# Patient Record
Sex: Female | Born: 1980
Health system: Southern US, Community
[De-identification: ages and names within clinical notes are randomized; demographics above are authoritative.]

## PROBLEM LIST (undated history)

## (undated) ENCOUNTER — Emergency Department

## (undated) DIAGNOSIS — L732 Hidradenitis suppurativa: Secondary | ICD-10-CM

## (undated) DIAGNOSIS — J45909 Unspecified asthma, uncomplicated: Secondary | ICD-10-CM

## (undated) HISTORY — PX: DILATION AND CURETTAGE OF UTERUS: SHX78

## (undated) HISTORY — PX: TONSILLECTOMY: SUR1361

## (undated) HISTORY — DX: Unspecified asthma, uncomplicated: J45.909

## (undated) HISTORY — DX: Hidradenitis suppurativa: L73.2

---

## 2003-10-24 DIAGNOSIS — L732 Hidradenitis suppurativa: Secondary | ICD-10-CM | POA: Insufficient documentation

## 2005-02-12 ENCOUNTER — Emergency Department: Payer: Self-pay | Admitting: Emergency Medicine

## 2005-10-19 ENCOUNTER — Emergency Department: Payer: Self-pay | Admitting: Emergency Medicine

## 2006-05-10 ENCOUNTER — Emergency Department: Payer: Self-pay | Admitting: Emergency Medicine

## 2006-05-18 ENCOUNTER — Encounter: Payer: Self-pay | Admitting: Emergency Medicine

## 2006-05-29 ENCOUNTER — Ambulatory Visit: Payer: Self-pay | Admitting: Orthopedic Surgery

## 2006-05-29 ENCOUNTER — Encounter: Payer: Self-pay | Admitting: Emergency Medicine

## 2006-06-03 ENCOUNTER — Emergency Department: Payer: Self-pay | Admitting: Emergency Medicine

## 2006-09-22 ENCOUNTER — Other Ambulatory Visit: Payer: Self-pay

## 2006-09-22 ENCOUNTER — Emergency Department: Payer: Self-pay | Admitting: Emergency Medicine

## 2007-10-27 ENCOUNTER — Observation Stay: Payer: Self-pay

## 2007-11-02 ENCOUNTER — Inpatient Hospital Stay: Payer: Self-pay

## 2007-11-02 ENCOUNTER — Emergency Department: Payer: Self-pay | Admitting: Emergency Medicine

## 2008-08-31 IMAGING — CR DG CHEST 2V
1 series · 2 of 2 positions shown · non-contrast
Comparison: none

REASON FOR EXAM: chest pain
COMMENTS:

PROCEDURE:     DXR - DXR CHEST PA (OR AP) AND LATERAL  - September 22, 2006  [DATE]
RESULT:     Comparison is made to the prior exam 10/19/2005. The lung fields
are clear. The heart, mediastinal and osseous structures are normal in
appearance.

[Series 1: view not recorded · 0.17mm/px · 2 of 2 slices shown]
[im 1/2]
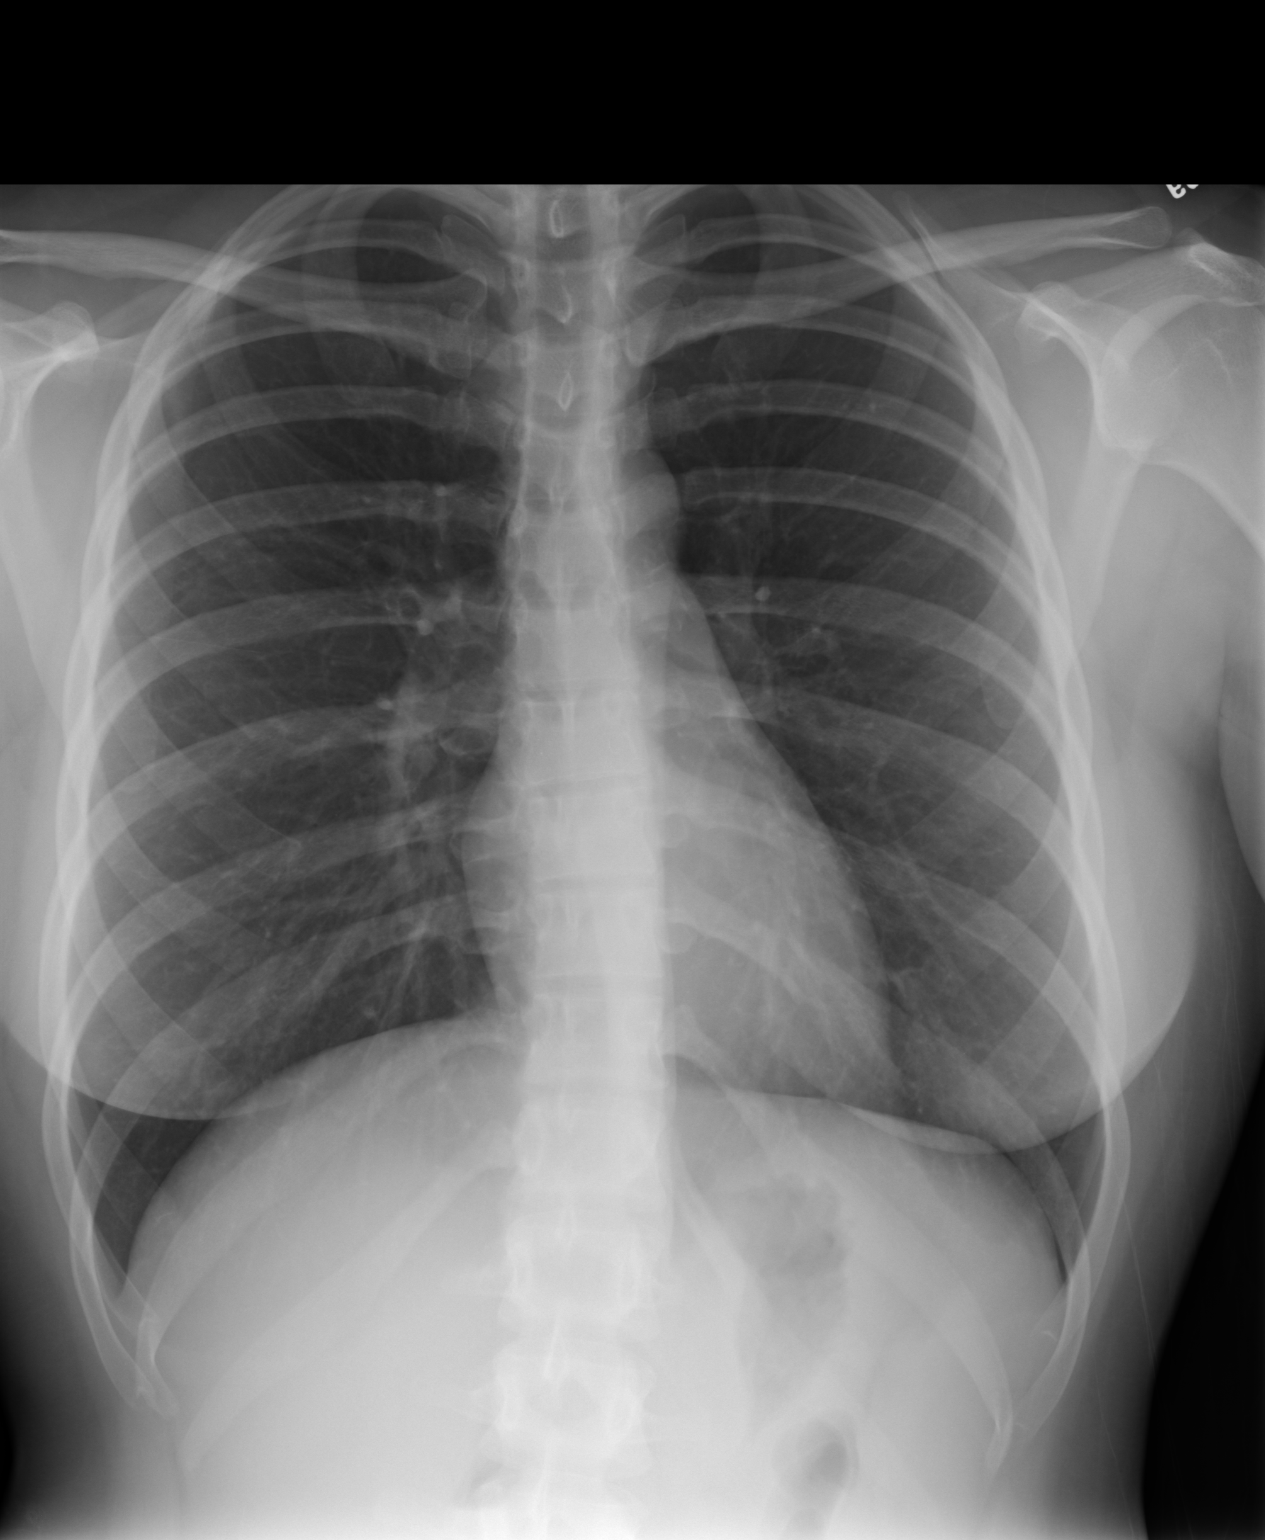
[im 2/2]
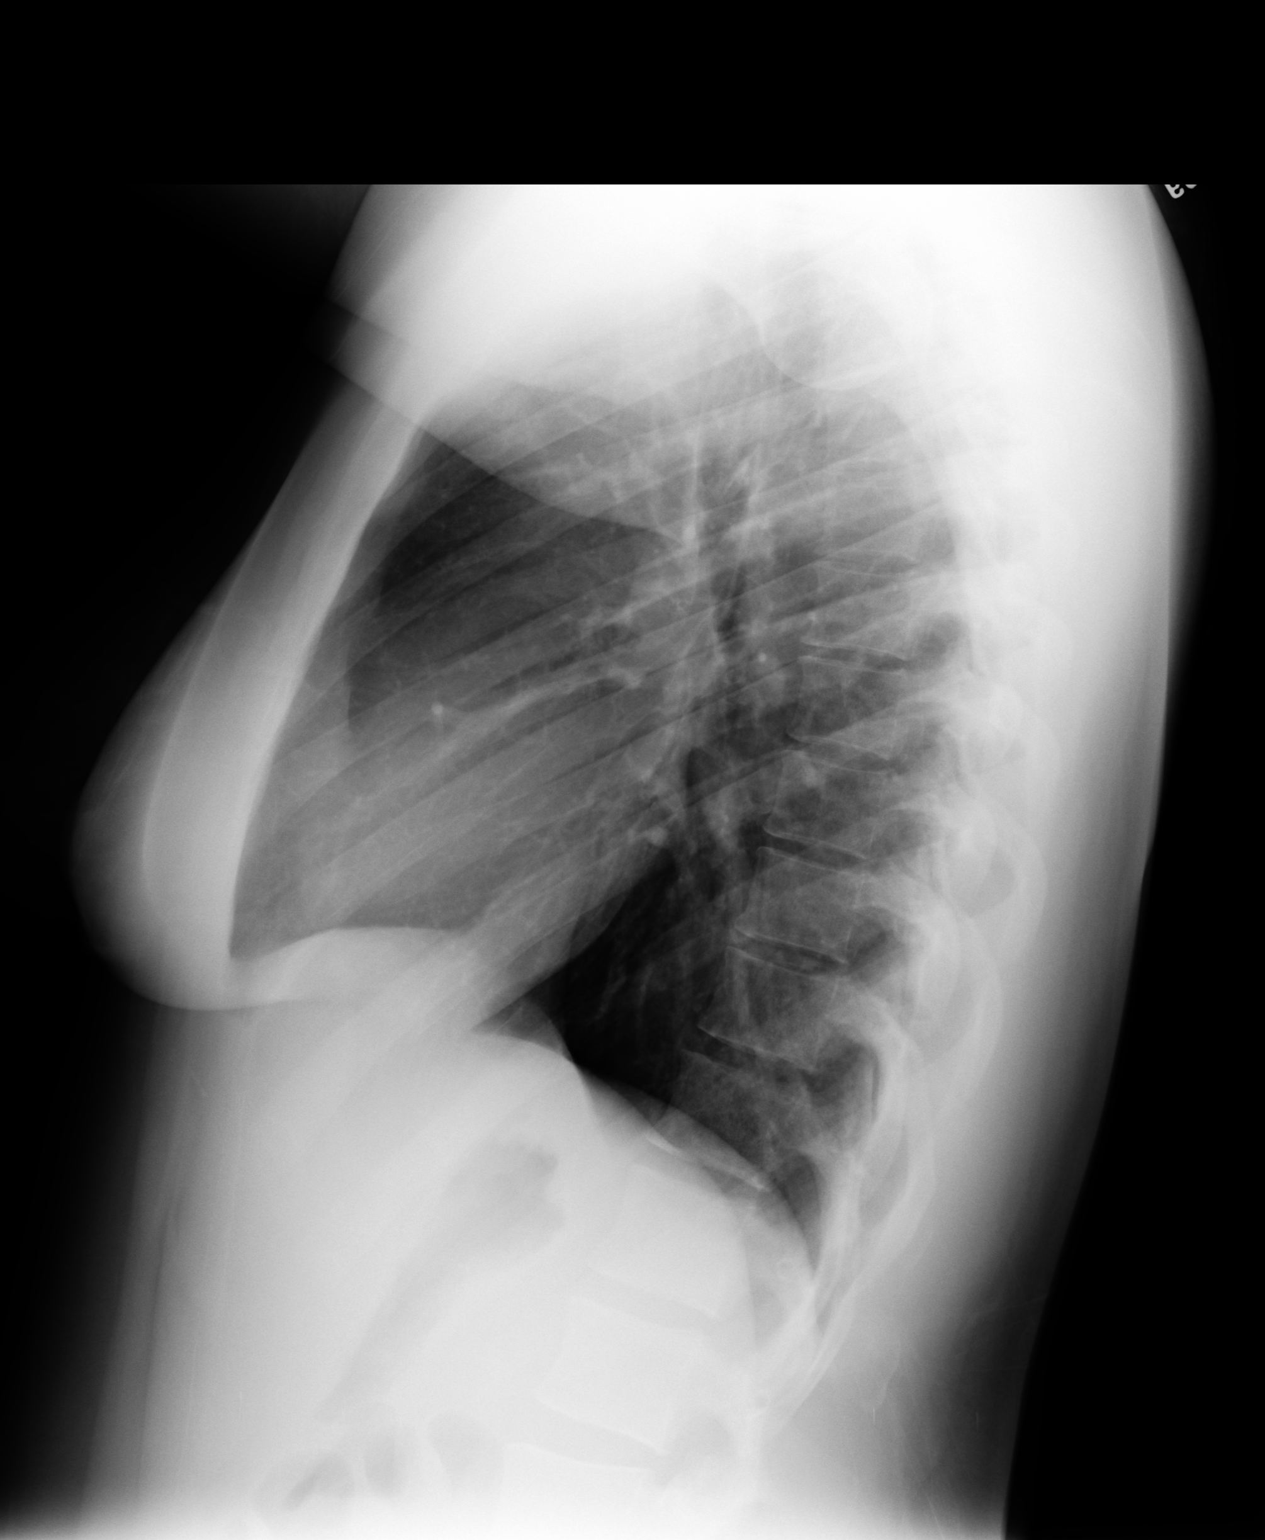

[2 of 2 positions shown; findings below may reference images not displayed]

IMPRESSION: 1.     No acute changes are identified.

## 2011-10-05 ENCOUNTER — Ambulatory Visit: Payer: Self-pay | Admitting: Emergency Medicine

## 2011-10-05 LAB — URINALYSIS, COMPLETE
Ketone: NEGATIVE
Nitrite: POSITIVE

## 2018-10-14 DIAGNOSIS — Z7189 Other specified counseling: Secondary | ICD-10-CM | POA: Diagnosis not present

## 2018-10-14 DIAGNOSIS — Z03818 Encounter for observation for suspected exposure to other biological agents ruled out: Secondary | ICD-10-CM | POA: Diagnosis not present

## 2019-07-16 DIAGNOSIS — J3489 Other specified disorders of nose and nasal sinuses: Secondary | ICD-10-CM | POA: Diagnosis not present

## 2019-07-16 DIAGNOSIS — R0981 Nasal congestion: Secondary | ICD-10-CM | POA: Diagnosis not present

## 2019-07-16 DIAGNOSIS — Z20822 Contact with and (suspected) exposure to covid-19: Secondary | ICD-10-CM | POA: Diagnosis not present

## 2019-07-16 DIAGNOSIS — R519 Headache, unspecified: Secondary | ICD-10-CM | POA: Diagnosis not present

## 2019-07-16 DIAGNOSIS — R05 Cough: Secondary | ICD-10-CM | POA: Diagnosis not present

## 2019-08-08 ENCOUNTER — Telehealth: Payer: Self-pay | Admitting: Obstetrics and Gynecology

## 2019-08-08 NOTE — Telephone Encounter (Signed)
Patient is schedule 08/24/19 at 9:30 with AMS for annual and mirena replacement in [place]

## 2019-08-24 ENCOUNTER — Ambulatory Visit (INDEPENDENT_AMBULATORY_CARE_PROVIDER_SITE_OTHER): Payer: 59 | Admitting: Obstetrics and Gynecology

## 2019-08-24 ENCOUNTER — Other Ambulatory Visit: Payer: Self-pay

## 2019-08-24 ENCOUNTER — Encounter: Payer: Self-pay | Admitting: Obstetrics and Gynecology

## 2019-08-24 ENCOUNTER — Other Ambulatory Visit (HOSPITAL_COMMUNITY)
Admission: RE | Admit: 2019-08-24 | Discharge: 2019-08-24 | Disposition: A | Discharge status: Home / Self Care | Payer: 59 | Source: Ambulatory Visit | Attending: Obstetrics and Gynecology | Admitting: Obstetrics and Gynecology

## 2019-08-24 VITALS — BP 129/93 | HR 108 | Ht 65.0 in | Wt 235.0 lb

## 2019-08-24 DIAGNOSIS — Z30011 Encounter for initial prescription of contraceptive pills: Secondary | ICD-10-CM | POA: Diagnosis not present

## 2019-08-24 DIAGNOSIS — Z113 Encounter for screening for infections with a predominantly sexual mode of transmission: Secondary | ICD-10-CM

## 2019-08-24 DIAGNOSIS — Z579 Occupational exposure to unspecified risk factor: Secondary | ICD-10-CM | POA: Diagnosis not present

## 2019-08-24 DIAGNOSIS — Z30432 Encounter for removal of intrauterine contraceptive device: Secondary | ICD-10-CM

## 2019-08-24 DIAGNOSIS — Z124 Encounter for screening for malignant neoplasm of cervix: Secondary | ICD-10-CM

## 2019-08-24 DIAGNOSIS — Z1239 Encounter for other screening for malignant neoplasm of breast: Secondary | ICD-10-CM | POA: Diagnosis not present

## 2019-08-24 DIAGNOSIS — T8332XA Displacement of intrauterine contraceptive device, initial encounter: Secondary | ICD-10-CM

## 2019-08-24 DIAGNOSIS — Z01419 Encounter for gynecological examination (general) (routine) without abnormal findings: Secondary | ICD-10-CM

## 2019-08-24 DIAGNOSIS — R87612 Low grade squamous intraepithelial lesion on cytologic smear of cervix (LGSIL): Secondary | ICD-10-CM | POA: Diagnosis not present

## 2019-08-24 MED ORDER — LO LOESTRIN FE 1 MG-10 MCG / 10 MCG PO TABS: 1.0000 | ORAL_TABLET | Freq: Every day | ORAL | 0 refills | Status: DC

## 2019-08-24 NOTE — Progress Notes (Signed)
Gynecology Annual Exam   PCP: Patient, No Pcp Per  Chief Complaint:  Chief Complaint  Patient presents with  . Gynecologic Exam  . Contraception    IUD replacement Mirena    History of Present Illness: Patient is a 39 y.o. [id number] presents for annual exam. The patient has no complaints today.   LMP: No LMP recorded. (Menstrual status: IUD). Amenorrhea secondary to Mirena IUD  The patient is sexually active. She currently uses IUD for contraception. She denies dyspareunia.  The patient does perform self breast exams.  There is no notable family history of breast or ovarian cancer in her family.  The patient wears seatbelts: yes.   The patient has regular exercise: not asked.    The patient denies current symptoms of depression.    She has been dealing with hydradenitis with mostly axillary manifestations.  Is interested in changing from IUD secondary to recommendation by her dermatologist.    Review of Systems: Review of Systems  Constitutional: Negative for chills and fever.  HENT: Negative for congestion.   Respiratory: Negative for cough and shortness of breath.   Cardiovascular: Negative for chest pain and palpitations.  Gastrointestinal: Negative for abdominal pain, constipation, diarrhea, heartburn, nausea and vomiting.  Genitourinary: Negative for dysuria, frequency and urgency.  Skin: Positive for rash. Negative for itching.  Neurological: Negative for dizziness and headaches.  Endo/Heme/Allergies: Negative for polydipsia.  Psychiatric/Behavioral: Negative for depression.    Past Medical History:  There are no problems to display for this patient.   Past Surgical History:  Past Surgical History:  Procedure Laterality Date  . DILATION AND CURETTAGE OF UTERUS     SAB    Gynecologic History:  No LMP recorded. (Menstrual status: IUD). Contraception:Mirena IUD  Obstetric History: No obstetric history on file.  Family History:  Family History  Problem  Relation Age of Onset  . Lung cancer Maternal Grandfather     Social History:  Social History   Socioeconomic History  . Marital status: Married    Spouse name: Not on file  . Number of children: Not on file  . Years of education: Not on file  . Highest education level: Not on file  Occupational History  . Occupation: [employer]  Tobacco Use  . Smoking status: Current Every Day Smoker    Types: E-cigarettes  . Smokeless tobacco: Never Used  Substance and Sexual Activity  . Alcohol use: Yes    Comment: Occ  . Drug use: Never  . Sexual activity: Yes    Partners: Male    Birth control/protection: I.U.D.  Other Topics Concern  . Not on file  Social History Narrative  . Not on file   Social Determinants of Health   Financial Resource Strain:   . Difficulty of Paying Living Expenses:   Food Insecurity:   . Worried About Charity fundraiser in the Last Year:   . Arboriculturist in the Last Year:   Transportation Needs:   . Film/video editor (Medical):   Marland Kitchen Lack of Transportation (Non-Medical):   Physical Activity:   . Days of Exercise per Week:   . Minutes of Exercise per Session:   Stress:   . Feeling of Stress :   Social Connections:   . Frequency of Communication with Friends and Family:   . Frequency of Social Gatherings with Friends and Family:   . Attends Religious Services:   . Active Member of Clubs or Organizations:   .  Attends Banker Meetings:   Marland Kitchen Marital Status:   Intimate Partner Violence:   . Fear of Current or Ex-Partner:   . Emotionally Abused:   Marland Kitchen Physically Abused:   . Sexually Abused:     Allergies:  Allergies  Allergen Reactions  . Penicillins Rash    Medications: Prior to Admission medications   Not on File    Physical Exam Vitals: Blood pressure (!) 129/93, pulse (!) 108, height 5\' 5"  (1.651 m), weight 235 lb (106.6 kg).  General: NAD HEENT: normocephalic, anicteric Thyroid: no enlargement, no palpable  nodules Pulmonary: No increased work of breathing, CTAB Cardiovascular: RRR, distal pulses 2+ Breast: Breast symmetrical, no tenderness, no palpable nodules or masses, no skin or nipple retraction present, no nipple discharge.  No axillary or supraclavicular lymphadenopathy. Abdomen: NABS, soft, non-tender, non-distended.  Umbilicus without lesions.  No hepatomegaly, splenomegaly or masses palpable. No evidence of hernia  Genitourinary:  External: Normal external female genitalia.  Normal urethral meatus, normal Bartholin's and Skene's glands.    Vagina: Normal vaginal mucosa, no evidence of prolapse.    Cervix: Grossly normal in appearance, no bleeding  Uterus: Non-enlarged, mobile, normal contour.  No CMT  Adnexa: ovaries non-enlarged, no adnexal masses  Rectal: deferred  Lymphatic: no evidence of inguinal lymphadenopathy Extremities: no edema, erythema, or tenderness Neurologic: Grossly intact Psychiatric: mood appropriate, affect full  Female chaperone present for pelvic and breast  portions of the physical exam   IUD Removal  Patient identified, informed consent performed, consent signed.  Patient was in the dorsal lithotomy position, normal external genitalia was noted.  A speculum was placed in the patient's vagina, normal discharge was noted, no lesions. The cervix was visualized, no lesions, no abnormal discharge.  The strings of the IUD were grasped and pulled using ring forceps. The strings of the IUD were not visualized, unable to be located with pap brush or Kelly forceps, so an IUD retrieval hook was introduced into the endometrial cavity following betadine application and the IUD was grasped and removed in its entirety.  Patient tolerated the procedure well.     Assessment: 39 y.o. 31  routine annual exam  Plan: Problem List Items Addressed This Visit    None    Visit Diagnoses    Encounter for gynecological examination without abnormal finding    -  Primary    Screening for malignant neoplasm of cervix       Relevant Orders   Cytology - PAP   Breast screening       Routine screening for STI (sexually transmitted infection)       Relevant Orders   Hepatitis C antibody   HEP, RPR, HIV Panel   Occupational exposure in workplace       Relevant Orders   Hepatitis C antibody   HEP, RPR, HIV Panel   Encounter for IUD removal       Initiation of oral contraception       Intrauterine contraceptive device threads lost, initial encounter          1) STI screening  wasoffered and accepted - no risk factors 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  Patient opts for every 3 years screening interval  3) Contraception - the patient is currently using  IUD.  She is interested in changing to [facility]  -  IUD removed today, switch to Lo Loestrin Fe - Patient not interested in future fertility discussed pros and cons of vasectomy and BTL - No clear data regarding OCP vs Mirena in clinical course of hydradenitis.  We discussed if menses heavy and no improvement in hydradenitis replacement of IUD may be reasonable  4) Routine healthcare maintenance including cholesterol, diabetes screening discussed managed by PCP   5) Return in about 3 months (around 11/24/2019) for medication follow up (in person, phone, or video).   Vena Austria, MD, [facility], [facility] 08/24/2019, 9:53 AM

## 2019-08-25 LAB — HEP, RPR, HIV PANEL
HIV Screen 4th Generation wRfx: NONREACTIVE
Hepatitis B Surface Ag: NEGATIVE
RPR Ser Ql: NONREACTIVE

## 2019-08-25 LAB — CYTOLOGY - PAP
Comment: NEGATIVE
High risk HPV: NEGATIVE

## 2019-08-25 LAB — HEPATITIS C ANTIBODY: Hep C Virus Ab: <0.1 s/co ratio (ref 0.0–0.9)

## 2019-11-20 ENCOUNTER — Telehealth (INDEPENDENT_AMBULATORY_CARE_PROVIDER_SITE_OTHER): Payer: 59 | Admitting: Obstetrics and Gynecology

## 2019-11-20 ENCOUNTER — Other Ambulatory Visit: Payer: Self-pay

## 2019-11-20 DIAGNOSIS — Z3009 Encounter for other general counseling and advice on contraception: Secondary | ICD-10-CM

## 2019-11-20 MED ORDER — ETONOGESTREL-ETHINYL ESTRADIOL 0.12-0.015 MG/24HR VA RING: VAGINAL_RING | VAGINAL | 3 refills | Status: DC

## 2019-11-20 NOTE — Progress Notes (Signed)
I connected with Haley Hall  on 11/20/19 at 11:30 AM EDT by video chat.   I discussed the limitations, risks, security and privacy concerns of performing an evaluation and management service by telephone and the availability of in person appointments. I also discussed with the patient that there may be a patient responsible charge related to this service. The patient expressed understanding and agreed to proceed.  The patient was at home. I spoke with the patient from my workstation phone. The names of people involved in this encounter were: Haley Hall , and [facility]   Obstetrics & Gynecology Office Visit   Chief Complaint: No chief complaint on file.   History of Present Illness: 39 y.o. [id number] presenting for medication follow up for a diagnosis of hydradenitis, with recommendation by dermatology to come off IUD.  She is currently being managed with Lo Loestrin FE.   The patient reports worsening in hydradenitis symptoms since discontinuing IUD.  On her current medication regimen has had good cycle control.   She has not noted any side-effects or new symptoms.    Review of Systems: Review of Systems  Constitutional: Negative.   Gastrointestinal: Negative.   Genitourinary: Negative.   Skin: Positive for rash. Negative for itching.    Past Medical History:  Past Medical History:  Diagnosis Date   Asthma    Hydradenitis     Past Surgical History:  Past Surgical History:  Procedure Laterality Date   DILATION AND CURETTAGE OF UTERUS     SAB    Gynecologic History: No LMP recorded. (Menstrual status: IUD).  Obstetric History: [id number]  Family History:  Family History  Problem Relation Age of Onset   Lung cancer Maternal Grandfather     Social History:  Social History   Socioeconomic History   Marital status: Married    Spouse name: Not on file   Number of children: Not on file   Years of education: Not on file   Highest education  level: Not on file  Occupational History   Occupation: [employer]  Tobacco Use   Smoking status: Current Every Day Smoker    Types: E-cigarettes   Smokeless tobacco: Never Used  Building services engineer Use: Every day  Substance and Sexual Activity   Alcohol use: Yes    Comment: Occ   Drug use: Never   Sexual activity: Yes    Partners: Male    Birth control/protection: I.U.D.  Other Topics Concern   Not on file  Social History Narrative   Not on file   Social Determinants of Health   Financial Resource Strain:    Difficulty of Paying Living Expenses: Not on file  Food Insecurity:    Worried About Running Out of Food in the Last Year: Not on file   Ran Out of Food in the Last Year: Not on file  Transportation Needs:    Lack of Transportation (Medical): Not on file   Lack of Transportation (Non-Medical): Not on file  Physical Activity:    Days of Exercise per Week: Not on file   Minutes of Exercise per Session: Not on file  Stress:    Feeling of Stress : Not on file  Social Connections:    Frequency of Communication with Friends and Family: Not on file   Frequency of Social Gatherings with Friends and Family: Not on file   Attends Religious Services: Not on file   Active Member of Clubs or Organizations: Not  on file   Attends Club or Organization Meetings: Not on file   Marital Status: Not on file  Intimate Partner Violence:    Fear of Current or Ex-Partner: Not on file   Emotionally Abused: Not on file   Physically Abused: Not on file   Sexually Abused: Not on file    Allergies:  Allergies  Allergen Reactions   Penicillins Rash    Medications: Prior to Admission medications   Medication Sig Start Date End Date Taking? Authorizing Provider  cetirizine (ZYRTEC) 10 MG tablet Take by mouth.    [provider]  etonogestrel-ethinyl estradiol (NUVARING) 0.12-0.015 MG/24HR vaginal ring Insert vaginally and leave in place for 3  consecutive weeks, then remove for 1 week. 11/20/19   Vena Austria, MD  levonorgestrel (MIRENA) 20 MCG/24HR IUD by Intrauterine route.    [provider]  Norethindrone-Ethinyl Estradiol-Fe Biphas (LO LOESTRIN FE) 1 MG-10 MCG / 10 MCG tablet Take 1 tablet by mouth daily. 08/24/19 11/16/19  Vena Austria, MD    Physical Exam Vitals: There were no vitals filed for this visit. No LMP recorded. (Menstrual status: IUD).  No physical exam as this was a remote telephone visit to promote social distancing during the current COVID-19 Pandemic   Assessment: 39 y.o. follow up contraceptive management  Plan: Problem List Items Addressed This Visit    None     1) Hydradenitis - no improvement in symptoms after IUD removal.  Menses regular on LoLoestrin.  Is interested in a more convenient contraceptive option so discussed Nuvaring, Depo Provera, contraceptive patch, nexplanon, ore replacement of IUD.  Discussed that worsening may not be related to IUD removal and starting of OCP but given hydradenitis is a disorder of the sweat glands it may be related to increased sweating in the summer months - patient interested in Nuvaring Rx sent  2) Video Time 17:21 minutes  3) Return in about 9 months (around 08/19/2020) for Annual.   Vena Austria, MD, [facility], [facility] 11/20/2019, 11:46 AM

## 2019-11-22 ENCOUNTER — Telehealth: Payer: 59 | Admitting: Obstetrics and Gynecology

## 2021-02-25 NOTE — Telephone Encounter (Signed)
Patient decided OCP's.  

## 2021-04-25 ENCOUNTER — Encounter: Payer: Self-pay | Admitting: Nurse Practitioner

## 2021-04-25 ENCOUNTER — Ambulatory Visit: Payer: 59 | Admitting: Nurse Practitioner

## 2021-04-25 ENCOUNTER — Other Ambulatory Visit: Payer: Self-pay

## 2021-04-25 ENCOUNTER — Other Ambulatory Visit (HOSPITAL_COMMUNITY): Payer: Self-pay

## 2021-04-25 VITALS — BP 132/84 | HR 98 | Temp 98.2°F | Resp 16 | Ht 65.0 in | Wt 265.0 lb

## 2021-04-25 DIAGNOSIS — Z13220 Encounter for screening for lipoid disorders: Secondary | ICD-10-CM

## 2021-04-25 DIAGNOSIS — R000 Tachycardia, unspecified: Secondary | ICD-10-CM | POA: Diagnosis not present

## 2021-04-25 DIAGNOSIS — Z131 Encounter for screening for diabetes mellitus: Secondary | ICD-10-CM | POA: Diagnosis not present

## 2021-04-25 DIAGNOSIS — F419 Anxiety disorder, unspecified: Secondary | ICD-10-CM

## 2021-04-25 DIAGNOSIS — L732 Hidradenitis suppurativa: Secondary | ICD-10-CM

## 2021-04-25 DIAGNOSIS — Z6841 Body Mass Index (BMI) 40.0 and over, adult: Secondary | ICD-10-CM | POA: Diagnosis not present

## 2021-04-25 DIAGNOSIS — F32A Depression, unspecified: Secondary | ICD-10-CM | POA: Diagnosis not present

## 2021-04-25 DIAGNOSIS — Z7689 Persons encountering health services in other specified circumstances: Secondary | ICD-10-CM

## 2021-04-25 MED ORDER — SERTRALINE HCL 25 MG PO TABS
25.0000 mg | ORAL_TABLET | Freq: Every day | ORAL | 0 refills | Status: DC
  Filled 2021-04-25: qty 30, 30d supply, fill #0

## 2021-04-25 NOTE — Progress Notes (Signed)
BP 132/84    Pulse 98    Temp 98.2 F (36.8 C) (Oral)    Resp 16    Ht 5\' 5"  (1.651 m)    Wt 265 lb (120.2 kg)    LMP 04/24/2021    SpO2 99%    BMI 44.10 kg/m    Subjective:    Patient ID: Haley Hall, female    DOB: July 22, 1980, 41 y.o.   MRN: [id number]  HPI: Haley Hall is a 41 y.o. female  Chief Complaint  Patient presents with   Establish Care   Obesity    Discuss weight loss options   Referral    dermatologist   Establish care: Last physical was many years ago. History of childhood asthma, hidradenitis suppurative.  She is not currently taking any medications right now.  Hidradenitis suppurative:  Would like referral to dermatology. She has a cream that she uses on it.  She says it is not bad right now but she says it can get flared up every once in awhile. She would like to see a dermatologist for further care.   Obesity: She says that her highest weigh is 265 lbs.  She says she has tried phentermine, diet, exercise., weight watchers, optevia.  She would like to start weight loss medication. We discussed all the options and she is going to call her insurance company and see what they will cover.  Will also get blood work.   Tachycardia: She says she has had tachycardia for many years.  Has done ekgs at work and they were Sinus Tachycardia. (She works at a cardiology office).  She denies any chest pain or shortness of breath. Just says her heart rate is always around 110-115.  Will get labs.  Anxiety/Depression:  PHQ9 and GAD are positive. She says that there is no one incident that is causing her mood. She says it is just life.  She denies any suicidal thoughts. Discussed if she would like to start medication and she said yes. Discussed different treatment options. Will start Zoloft and follow-up in four weeks.  Depression screen PHQ 2/9 04/25/2021  Decreased Interest 1  Down, Depressed, Hopeless 1  PHQ - 2 Score 2  Altered sleeping 3  Tired, decreased energy 3   Change in appetite 0  Feeling bad or failure about yourself  1  Trouble concentrating 3  Moving slowly or fidgety/restless 1  Suicidal thoughts 0  PHQ-9 Score 13  Difficult doing work/chores Somewhat difficult    GAD 7 : Generalized Anxiety Score 04/25/2021  Nervous, Anxious, on Edge 2  Control/stop worrying 2  Worry too much - different things 2  Trouble relaxing 2  Restless 2  Easily annoyed or irritable 2  Afraid - awful might happen 2  Total GAD 7 Score 14  Anxiety Difficulty Somewhat difficult     Relevant past medical, surgical, family and social history reviewed and updated as indicated. Interim medical history since our last visit reviewed. Allergies and medications reviewed and updated.  Review of Systems  Constitutional: Negative for fever, positive for  weight change.  Respiratory: Negative for cough and shortness of breath.   Cardiovascular: Negative for chest pain or palpitations.  Gastrointestinal: Negative for abdominal pain, no bowel changes.  Musculoskeletal: Negative for gait problem or joint swelling.  Skin: Negative for rash.  Neurological: Negative for dizziness or headache.  No other specific complaints in a complete review of systems (except as listed in HPI above).  Objective:    BP 132/84    Pulse 98    Temp 98.2 F (36.8 C) (Oral)    Resp 16    Ht 5\' 5"  (1.651 m)    Wt 265 lb (120.2 kg)    LMP 04/24/2021    SpO2 99%    BMI 44.10 kg/m   Wt Readings from Last 3 Encounters:  04/25/21 265 lb (120.2 kg)  08/24/19 235 lb (106.6 kg)    Physical Exam  Constitutional: Patient appears well-developed and well-nourished. Obese No distress.  HEENT: head atraumatic, normocephalic, pupils equal and reactive to light, , neck supple Cardiovascular: Normal rate, regular rhythm and normal heart sounds.  No murmur heard. No BLE edema. Pulmonary/Chest: Effort normal and breath sounds normal. No respiratory distress. Abdominal: Soft.  There is no  tenderness. Psychiatric: Patient has a normal mood and affect. behavior is normal. Judgment and thought content normal.   Results for orders placed or performed in visit on 08/24/19  Hepatitis C antibody  Result Value Ref Range   Hep C Virus Ab <0.1 0.0 - 0.9 s/co ratio  HEP, RPR, HIV Panel  Result Value Ref Range   Hepatitis B Surface Ag Negative Negative   RPR Ser Ql Non Reactive Non Reactive   HIV Screen 4th Generation wRfx Non Reactive Non Reactive  Cytology - PAP  Result Value Ref Range   High risk HPV Negative    Adequacy      Satisfactory for evaluation; transformation zone component PRESENT.   Diagnosis - Low grade squamous intraepithelial lesion (LSIL) (A)    Comment Normal Reference Range HPV - Negative       Assessment & Plan:   1. Hidradenitis suppurativa  - Ambulatory referral to Dermatology  2. Class 3 severe obesity with serious comorbidity and body mass index (BMI) of 40.0 to 44.9 in adult, unspecified obesity type (HCC)  - Lipid panel - TSH - Hemoglobin A1c  3. Tachycardia  - CBC with Differential/Platelet - COMPLETE METABOLIC PANEL WITH GFR - TSH  4. Anxiety and depression  - TSH - sertraline (ZOLOFT) 25 MG tablet; Take 1 tablet (25 mg total) by mouth daily.  Dispense: 30 tablet; Refill: 0  5. Encounter to establish care - make appointment for cpe  6. Screening for diabetes mellitus  - Hemoglobin A1c  7. Screening for cholesterol level  - Lipid panel   Follow up plan: Return in about 4 weeks (around 05/23/2021) for follow up.

## 2021-04-26 LAB — LIPID PANEL
Cholesterol: 158 mg/dL (ref <200)
HDL: 49 mg/dL — ABNORMAL LOW (ref >=50)
LDL Cholesterol (Calc): 93 mg/dL (calc)
Non-HDL Cholesterol (Calc): 109 mg/dL (calc) (ref <130)
Total CHOL/HDL Ratio: 3.2 (calc) (ref <5.0)
Triglycerides: 74 mg/dL (ref <150)

## 2021-04-26 LAB — COMPLETE METABOLIC PANEL WITH GFR
AG Ratio: 1.6 (calc) (ref 1.0–2.5)
ALT: 10 U/L (ref 6–29)
AST: 9 U/L — ABNORMAL LOW (ref 10–30)
Albumin: 4.4 g/dL (ref 3.6–5.1)
Alkaline phosphatase (APISO): 81 U/L (ref 31–125)
BUN: 9 mg/dL (ref 7–25)
CO2: 28 mmol/L (ref 20–32)
Calcium: 9.7 mg/dL (ref 8.6–10.2)
Chloride: 103 mmol/L (ref 98–110)
Creat: 0.63 mg/dL (ref 0.50–0.97)
Globulin: 2.8 g/dL (calc) (ref 1.9–3.7)
Glucose, Bld: 98 mg/dL (ref 65–99)
Potassium: 4.4 mmol/L (ref 3.5–5.3)
Sodium: 139 mmol/L (ref 135–146)
Total Bilirubin: 0.2 mg/dL (ref 0.2–1.2)
Total Protein: 7.2 g/dL (ref 6.1–8.1)
eGFR: 116 mL/min/{1.73_m2} (ref >=60)

## 2021-04-26 LAB — CBC WITH DIFFERENTIAL/PLATELET
Absolute Monocytes: 525 cells/uL (ref 200–950)
Basophils Absolute: 41 cells/uL (ref 0–200)
Basophils Relative: 0.5 %
Eosinophils Absolute: 131 cells/uL (ref 15–500)
Eosinophils Relative: 1.6 %
HCT: 37.3 % (ref 35.0–45.0)
Hemoglobin: 12.5 g/dL (ref 11.7–15.5)
Lymphs Abs: 1911 cells/uL (ref 850–3900)
MCH: 29.5 pg (ref 27.0–33.0)
MCHC: 33.5 g/dL (ref 32.0–36.0)
MCV: 88 fL (ref 80.0–100.0)
MPV: 9.3 fL (ref 7.5–12.5)
Monocytes Relative: 6.4 %
Neutro Abs: 5592 cells/uL (ref 1500–7800)
Neutrophils Relative %: 68.2 %
Platelets: 375 10*3/uL (ref 140–400)
RBC: 4.24 10*6/uL (ref 3.80–5.10)
RDW: 12.2 % (ref 11.0–15.0)
Total Lymphocyte: 23.3 %
WBC: 8.2 10*3/uL (ref 3.8–10.8)

## 2021-04-26 LAB — HEMOGLOBIN A1C
Hgb A1c MFr Bld: 5.7 % of total Hgb — ABNORMAL HIGH (ref <5.7)
Mean Plasma Glucose: 117 mg/dL
eAG (mmol/L): 6.5 mmol/L

## 2021-04-26 LAB — TSH: TSH: 1.73 mIU/L

## 2021-05-01 ENCOUNTER — Ambulatory Visit (INDEPENDENT_AMBULATORY_CARE_PROVIDER_SITE_OTHER): Payer: 59

## 2021-05-01 ENCOUNTER — Other Ambulatory Visit (INDEPENDENT_AMBULATORY_CARE_PROVIDER_SITE_OTHER): Payer: 59

## 2021-05-01 DIAGNOSIS — I493 Ventricular premature depolarization: Secondary | ICD-10-CM

## 2021-05-01 DIAGNOSIS — R000 Tachycardia, unspecified: Secondary | ICD-10-CM | POA: Diagnosis not present

## 2021-05-01 NOTE — Progress Notes (Signed)
Patient had EKG done in the office today. Reviewed by Dr. Mayford Knife. EKG showed frequent PVC's. Blood pressure was 172/106. Per Dr. Mayford Knife order echocardiogram, 3 day event monitor, and Itamar sleep study.

## 2021-05-01 NOTE — Progress Notes (Unsigned)
Applied a 3 day Zio XT monitor to patient in the office 

## 2021-05-01 NOTE — Progress Notes (Signed)
STOP BANG RISK ASSESSMENT S (snore) Have you been told that you snore?     YES   T (tired) Are you often tired, fatigued, or sleepy during the day?   YES  O (obstruction) Do you stop breathing, choke, or gasp during sleep? NO   P (pressure) Do you have or are you being treated for high blood pressure? YES   B (BMI) Is your body index greater than 35 kg/m? YES   A (age) Are you 39 years old or older? NO   N (neck) Do you have a neck circumference greater than 16 inches?   NO   G (gender) Are you a female? NO   TOTAL STOP/BANG YES ANSWERS 4                                                                       For Office Use Only              Procedure Order Form    YES to 3+ Stop Bang questions OR two clinical symptoms - patient qualifies for WatchPAT (CPT 95800)             Clinical Notes: Will consult Sleep Specialist and refer for management of therapy due to patient increased risk of Sleep Apnea. Ordering a sleep study due to the following two clinical symptoms: Excessive daytime sleepiness G47.10 / Gastroesophageal reflux K21.9 / Nocturia R35.1 / Morning Headaches G44.221 / Difficulty concentrating R41.840 / Memory problems or poor judgment G31.84 / Personality changes or irritability R45.4 / Loud snoring R06.83 / Depression F32.9 / Unrefreshed by sleep G47.8 / Impotence N52.9 / History of high blood pressure R03.0 / Insomnia G47.00    I understand that I am proceeding with a home sleep apnea test as ordered by my treating physician. I understand that untreated sleep apnea is a serious cardiovascular risk factor and it is my responsibility to perform the test and seek management for sleep apnea. I will be contacted with the results and be managed for sleep apnea by a local sleep physician. I will be receiving equipment and further instructions from [facility]. I shall promptly ship back the equipment via the included mailing label. I understand my insurance will be billed for the  test and as the patient I am responsible for any insurance related out-of-pocket costs incurred. I have been provided with written instructions and can call for additional video or telephonic instruction, with 24-hour availability of qualified personnel to answer any questions: Patient Help Desk [phone].   Patient Telemedicine Verbal Consent

## 2021-05-02 ENCOUNTER — Telehealth: Payer: Self-pay

## 2021-05-02 DIAGNOSIS — I10 Essential (primary) hypertension: Secondary | ICD-10-CM

## 2021-05-02 DIAGNOSIS — R000 Tachycardia, unspecified: Secondary | ICD-10-CM

## 2021-05-02 DIAGNOSIS — I493 Ventricular premature depolarization: Secondary | ICD-10-CM

## 2021-05-02 NOTE — Telephone Encounter (Signed)
Prior Authorization for [facility] sent to [facility] via Phone. NO PA- AUTHORIZED,  CALL REF# 05-02-2021 REP# Eliberto Ivory.

## 2021-05-02 NOTE — Telephone Encounter (Signed)
Patient reports the following orthostatic vital signs: Lying: HR - 97, BP - 155/111 Sitting: HR: 107, BP - 155/106 Standing: HR: 114, BP - 160/105 Standing: HR - 115, BP - 166/104 Patient denies any symptoms. She does report that she gets occasional headaches with increased blood pressure.  Patient is currently wearing a 3 day zio patch. Echocardiogram is scheduled for 2/8. She will complete her Itamar study by the beginning of next week.

## 2021-05-05 ENCOUNTER — Encounter (INDEPENDENT_AMBULATORY_CARE_PROVIDER_SITE_OTHER): Payer: 59 | Admitting: Cardiology

## 2021-05-05 ENCOUNTER — Other Ambulatory Visit: Payer: 59 | Admitting: *Deleted

## 2021-05-05 ENCOUNTER — Other Ambulatory Visit: Payer: Self-pay

## 2021-05-05 ENCOUNTER — Other Ambulatory Visit (HOSPITAL_COMMUNITY): Payer: Self-pay

## 2021-05-05 DIAGNOSIS — I10 Essential (primary) hypertension: Secondary | ICD-10-CM

## 2021-05-05 DIAGNOSIS — I493 Ventricular premature depolarization: Secondary | ICD-10-CM

## 2021-05-05 DIAGNOSIS — R000 Tachycardia, unspecified: Secondary | ICD-10-CM | POA: Diagnosis not present

## 2021-05-05 MED ORDER — METOPROLOL SUCCINATE ER 25 MG PO TB24: 25.0000 mg | ORAL_TABLET | Freq: Every day | ORAL | 3 refills | Status: DC

## 2021-05-05 MED ORDER — METOPROLOL SUCCINATE ER 25 MG PO TB24
25.0000 mg | ORAL_TABLET | Freq: Every day | ORAL | 3 refills | Status: DC
  Filled 2021-05-05: qty 90, 90d supply, fill #0

## 2021-05-05 NOTE — Addendum Note (Signed)
Addended by: Antonieta Iba on: 05/05/2021 11:37 AM   Modules accepted: Orders

## 2021-05-05 NOTE — Addendum Note (Signed)
Addended by: Antonieta Iba on: 05/05/2021 08:13 AM   Modules accepted: Orders

## 2021-05-05 NOTE — Telephone Encounter (Signed)
Orders have been placed. Patient is aware.

## 2021-05-06 ENCOUNTER — Ambulatory Visit: Payer: 59

## 2021-05-06 ENCOUNTER — Other Ambulatory Visit: Payer: Self-pay | Admitting: Cardiology

## 2021-05-06 DIAGNOSIS — R000 Tachycardia, unspecified: Secondary | ICD-10-CM | POA: Diagnosis not present

## 2021-05-06 DIAGNOSIS — I493 Ventricular premature depolarization: Secondary | ICD-10-CM | POA: Diagnosis not present

## 2021-05-06 DIAGNOSIS — I10 Essential (primary) hypertension: Secondary | ICD-10-CM | POA: Diagnosis not present

## 2021-05-06 NOTE — Procedures (Signed)
° ° °  Sleep Study Report Patient Information Study Date: 05/05/21 Patient Name: Haley Hall Patient ID: [id number] Birth Date: March 31, 1980 Age: 41 Gender: Female BMI: 42.5 (W=264 lb, H=5' 6'') Referring Physician: Armanda Magic, MD  TEST DESCRIPTION: Home sleep apnea testing was completed using the WatchPat, a Type 1 device, utilizing peripheral arterial tonometry (PAT), chest movement, actigraphy, pulse oximetry, pulse rate, body position and snore. AHI was calculated with apnea and hypopnea using valid sleep time as the denominator. RDI includes apneas, hypopneas, and RERAs. The data acquired and the scoring of sleep and all associated events were performed in accordance with the recommended standards and specifications as outlined in the AASM Manual for the Scoring of Sleep and Associated Events 2.2.0 (2015).  FINDINGS: 1. No evidence of Obstructive Sleep Apnea with AHI1.3 /hr. 2. No Central Sleep Apnea. 3. Oxygen desaturations as low as 89%. 4. Minimal snoring was present. O2 sats were < 88% for 0 minutes. 5. Total sleep time was 8 hrs and 22 min. 6. 30.5% of total sleep time was spent in REM sleep. 7. Normal sleep onset latency at 16 min. 8. Normal REM sleep onset latency at 76 min. 9. Total awakenings were 2.  DIAGNOSIS: Normal study with no significant sleep disordered breathing.  RECOMMENDATIONS: 1. Normal study with no significant sleep disordered breathing.  2. Healthy sleep recommendations include: adequate nightly sleep (normal 7-9 hrs/night), avoidance of caffeine after noon and alcohol near bedtime, and maintaining a sleep environment that is cool, dark and quiet.  3. Weight loss for overweight patients is recommended.  4. Snoring recommendations include: weight loss where appropriate, side sleeping, and avoidance of alcohol before bed.  5. Operation of motor vehicle or dangerous equipment must be avoided when feeling drowsy, excessively sleepy, or mentally  fatigued.  6. An ENT consultation which may be useful for specific causes of and possible treatment of bothersome snoring .  7. Weight loss may be of benefit in reducing the severity of snoring.    Signature: Electronically Signed: 05/06/21 Armanda Magic, MD; [facility]; Diplomat, American Board of Sleep Medicine

## 2021-05-07 ENCOUNTER — Telehealth: Payer: Self-pay | Admitting: *Deleted

## 2021-05-07 ENCOUNTER — Other Ambulatory Visit: Payer: Self-pay

## 2021-05-07 ENCOUNTER — Ambulatory Visit (HOSPITAL_COMMUNITY): Payer: 59 | Attending: Cardiology

## 2021-05-07 DIAGNOSIS — R000 Tachycardia, unspecified: Secondary | ICD-10-CM | POA: Diagnosis not present

## 2021-05-07 DIAGNOSIS — I493 Ventricular premature depolarization: Secondary | ICD-10-CM | POA: Insufficient documentation

## 2021-05-07 DIAGNOSIS — I10 Essential (primary) hypertension: Secondary | ICD-10-CM

## 2021-05-07 LAB — ECHOCARDIOGRAM COMPLETE: S' Lateral: 2.9 cm

## 2021-05-07 NOTE — Telephone Encounter (Signed)
-----   Message from Gaynelle Cage, CMA sent at 05/07/2021 10:47 AM EST -----  ----- Message ----- From: Quintella Reichert, MD Sent: 05/06/2021  10:26 AM EST To: [facility] Div Sleep Studies  Normal study

## 2021-05-07 NOTE — Telephone Encounter (Signed)
The patient has been notified of the result and verbalized understanding.  All questions (if any) were answered. Marolyn Hammock, [place] 05/07/2021 11:02 AM    Pt is aware and agreeable to normal results.

## 2021-05-08 ENCOUNTER — Encounter (HOSPITAL_COMMUNITY): Payer: 59

## 2021-05-09 ENCOUNTER — Encounter (HOSPITAL_COMMUNITY): Payer: 59

## 2021-05-12 DIAGNOSIS — R000 Tachycardia, unspecified: Secondary | ICD-10-CM | POA: Diagnosis not present

## 2021-05-12 DIAGNOSIS — I493 Ventricular premature depolarization: Secondary | ICD-10-CM | POA: Diagnosis not present

## 2021-05-12 LAB — CATECHOLAMINES, FRACTIONATED, URINE, 24 HOUR
Dopamine , 24H Ur: 393 ug/24 hr (ref 0–510)
Dopamine, Rand Ur: 393 ug/L
Epinephrine, 24H Ur: 15 ug/24 hr (ref 0–20)
Epinephrine, Rand Ur: 15 ug/L
Norepinephrine, 24H Ur: 49 ug/24 hr (ref 0–135)
Norepinephrine, Rand Ur: 49 ug/L

## 2021-05-12 LAB — VANILLYLMANDELIC ACID, 24-HR U
VMA, 24H Ur Adult: 3.9 mg/24 hr (ref 0.0–7.5)
VMA, Urine: 3.9 mg/L

## 2021-05-12 LAB — METANEPHRINES, URINE, 24 HOUR
Metaneph Total, Ur: 170 ug/L
Metanephrines, 24H Ur: 170 ug/24 hr (ref 36–209)
Normetanephrine, 24H Ur: 237 ug/24 hr (ref 131–612)
Normetanephrine, Ur: 237 ug/L

## 2021-05-13 ENCOUNTER — Other Ambulatory Visit (HOSPITAL_COMMUNITY): Payer: Self-pay

## 2021-05-13 ENCOUNTER — Encounter: Payer: Self-pay | Admitting: Cardiology

## 2021-05-13 ENCOUNTER — Ambulatory Visit: Payer: 59 | Admitting: Cardiology

## 2021-05-13 ENCOUNTER — Other Ambulatory Visit: Payer: Self-pay

## 2021-05-13 VITALS — BP 136/90 | HR 103 | Ht 65.0 in | Wt 268.2 lb

## 2021-05-13 DIAGNOSIS — I4711 Inappropriate sinus tachycardia, so stated: Secondary | ICD-10-CM

## 2021-05-13 DIAGNOSIS — Z6841 Body Mass Index (BMI) 40.0 and over, adult: Secondary | ICD-10-CM

## 2021-05-13 DIAGNOSIS — I10 Essential (primary) hypertension: Secondary | ICD-10-CM | POA: Diagnosis not present

## 2021-05-13 DIAGNOSIS — E66813 Obesity, class 3: Secondary | ICD-10-CM

## 2021-05-13 DIAGNOSIS — R000 Tachycardia, unspecified: Secondary | ICD-10-CM

## 2021-05-13 DIAGNOSIS — I493 Ventricular premature depolarization: Secondary | ICD-10-CM

## 2021-05-13 DIAGNOSIS — R7303 Prediabetes: Secondary | ICD-10-CM

## 2021-05-13 LAB — ALDOSTERONE + RENIN ACTIVITY W/ RATIO
ALDOS/RENIN RATIO: 5.2 (ref 0.0–30.0)
ALDOSTERONE: 3.1 ng/dL (ref 0.0–30.0)
Renin: 0.596 ng/mL/hr (ref 0.167–5.380)

## 2021-05-13 MED ORDER — NEBIVOLOL HCL 5 MG PO TABS
5.0000 mg | ORAL_TABLET | Freq: Every evening | ORAL | 3 refills | Status: DC
  Filled 2021-05-13: qty 90, 90d supply, fill #0

## 2021-05-13 NOTE — Patient Instructions (Addendum)
Medication Instructions:  Your physician has recommended you make the following change in your medication: 1) STOP taking Toprol  2) START taking Bystolic 5 mg every night  *If you need a refill on your cardiac medications before your next appointment, please call your pharmacy*  Testing/Procedures: Your physician has requested that you have a calcium score CT scan. There is a $99 fee for the scan.   Follow-Up: At [facility], you and your health needs are our priority.  As part of our continuing mission to provide you with exceptional heart care, we have created designated Provider Care Teams.  These Care Teams include your primary Cardiologist (physician) and Advanced Practice Providers (APPs -  Physician Assistants and Nurse Practitioners) who all work together to provide you with the care you need, when you need it.  Your next appointment:   3 month(s)  The format for your next appointment:   In Person  Provider:   Armanda Magic, MD     Other Instructions You have been referred to see an Endocrinologist, Dr. Talmage Nap

## 2021-05-13 NOTE — Progress Notes (Signed)
Cardiology CONSULT Note    Date:  05/13/2021   ID:  Haley Hall, DOB 11-17-1980, MRN [id number]  PCP:  Bo Merino, FNP  Cardiologist:  Fransico Him, MD   Chief Complaint  Patient presents with   New Patient (Initial Visit)    Hypertension, inappropriate sinus tachycardia, PVCs    History of Present Illness:  Haley Hall is a 41 y.o. female who is being seen today for the evaluation of tachycardia at the request of Serafina Royals, [place].  This is a 41 year old female with a history of asthma and hidradenitis who has been experiencing problems with palpitations recently.  With resting heart rates consistently above 100 bpm even at rest.  She recently wore an event monitor which showed normal sinus rhythm and sinus tachycardia with average heart rate 101 bpm and occasional PVCs, bigeminal and trigeminal PVCs, ventricular couplets and triplets.  Her PVC load was less than 1%.  She is also been having problems with hypertension with significantly elevated blood pressures.  24-hour urine for catecholamines/VMA, metanephrines and dopamine was normal.  Plasma renin and aldosterone levels as well as PRA were normal and TSH was normal she has a renal duplex pending to rule out renal artery stenosis.  She underwent home sleep study which showed no evidence of sleep apnea.  She denies any chest pain or pressure,  PND, orthopnea, LE edema, dizziness, or syncope. She does admit to having DOE at times going up stairs but attributes it to being out of shape. Since going on Toprol her palpitations have significantly improved. She is compliant with her meds and is tolerating meds with no SE.     Past Medical History:  Diagnosis Date   Asthma    Hydradenitis     Past Surgical History:  Procedure Laterality Date   DILATION AND CURETTAGE OF UTERUS     SAB   TONSILLECTOMY      Current Medications: Current Meds  Medication Sig   nebivolol (BYSTOLIC) 5 MG tablet Take 1 tablet (5 mg  total) by mouth at bedtime.   sertraline (ZOLOFT) 25 MG tablet Take 1 tablet (25 mg total) by mouth daily.   [DISCONTINUED] metoprolol succinate (TOPROL XL) 25 MG 24 hr tablet Take 1 tablet (25 mg total) by mouth daily.    Allergies:   Other and Penicillins   Social History   Socioeconomic History   Marital status: Married    Spouse name: Not on file   Number of children: Not on file   Years of education: Not on file   Highest education level: Not on file  Occupational History   Occupation: [employer]  Tobacco Use   Smoking status: Some Days    Types: E-cigarettes   Smokeless tobacco: Never  Vaping Use   Vaping Use: Every day  Substance and Sexual Activity   Alcohol use: Yes    Comment: Occ   Drug use: Never   Sexual activity: Yes    Partners: Male    Birth control/protection: I.U.D.  Other Topics Concern   Not on file  Social History Narrative   Not on file   Social Determinants of Health   Financial Resource Strain: Not on file  Food Insecurity: Not on file  Transportation Needs: Not on file  Physical Activity: Not on file  Stress: Not on file  Social Connections: Not on file     Family History:  The patient's family history includes ADD / ADHD in her sister;  Hypertension in her mother; Lung cancer in her maternal grandfather.   ROS:   Please see the history of present illness.    ROS All other systems reviewed and are negative.  No flowsheet data found.     PHYSICAL EXAM:   VS:  BP 136/90    Pulse (!) 103    Ht 5' 5"  (1.651 m)    Wt 268 lb 3.2 oz (121.7 kg)    LMP 04/24/2021    SpO2 97%    BMI 44.63 kg/m    GEN: Well nourished, well developed, in no acute distress  HEENT: normal  Neck: no JVD, carotid bruits, or masses Cardiac: RRR; no murmurs, rubs, or gallops,no edema.  Intact distal pulses bilaterally.  Respiratory:  clear to auscultation bilaterally, normal work of breathing GI: soft, nontender, nondistended, + BS MS: no deformity or  atrophy  Skin: warm and dry, no rash Neuro:  Alert and Oriented x 3, Strength and sensation are intact Psych: euthymic mood, full affect  Wt Readings from Last 3 Encounters:  05/13/21 268 lb 3.2 oz (121.7 kg)  04/25/21 265 lb (120.2 kg)  08/24/19 235 lb (106.6 kg)      Studies/Labs Reviewed:   EKG:  EKG is not ordered today.    Recent Labs: 04/25/2021: ALT 10; BUN 9; Creat 0.63; Hemoglobin 12.5; Platelets 375; Potassium 4.4; Sodium 139; TSH 1.73   Lipid Panel    Component Value Date/Time   CHOL 158 04/25/2021 1130   TRIG 74 04/25/2021 1130   HDL 49 (L) 04/25/2021 1130   CHOLHDL 3.2 04/25/2021 1130   LDLCALC 93 04/25/2021 1130     Additional studies/ records that were reviewed today include:  2D echo, labs, event monitor    ASSESSMENT:    1. Primary hypertension   2. Inappropriate sinus tachycardia   3. PVC (premature ventricular contraction)   4. Class 3 severe obesity due to excess calories with serious comorbidity and body mass index (BMI) of 40.0 to 44.9 in adult ([place])   5. Pre-diabetes      PLAN:  In order of problems listed above:  HTN -Suspect this is essential hypertension as we have ruled out all secondary causes of hypertension except for renal artery stenosis and she has a renal duplex pending -she has had problems with sleepiness with the Toprol even taking it at night so I will change it to Bystolic 25m daily  2.  Inappropriate sinus tachycardia -Orthostatic blood pressures did not show any marked increase in tachycardia or drop in blood pressure so I do not think this is POTS -Event monitor showed an average heart rate of 101 bpm and a range of 53 to 160 bpm -changing Toprol to Bystolic 574mdaily  3.  PVCs -She is fairly asymptomatic with these -2D echo showed normal LV function with EF 60 to 65% and normal global longitudinal LV strain at -20.9%. -TSH and be met were normal -continue BB -I will get a coronary calcium score given her history  of tobacco use, borderline HbA1C and HTN  4.  Elevated HbA1C -she is borderline pre diabetic and morbidly obese -I am concerned that she may have insulin resistance and has had problems with losing weight -I will refer her to Endocrinology (Dr. BaChalmers Caterfor evaluation>>I think she will benefit from consideration of weight loss injection and possibly Metformin  Time Spent: 20 minutes total time of encounter, including 15 minutes spent in face-to-face patient care on the date of this encounter. This  time includes coordination of care and counseling regarding above mentioned problem list. Remainder of non-face-to-face time involved reviewing chart documents/testing relevant to the patient encounter and documentation in the medical record. I have independently reviewed documentation from referring provider  Medication Adjustments/Labs and Tests Ordered: Current medicines are reviewed at length with the patient today.  Concerns regarding medicines are outlined above.  Medication changes, Labs and Tests ordered today are listed in the Patient Instructions below.  Patient Instructions  Medication Instructions:  Your physician has recommended you make the following change in your medication: 1) STOP taking Toprol  2) START taking Bystolic 5 mg every night  *If you need a refill on your cardiac medications before your next appointment, please call your pharmacy*  Testing/Procedures: Your physician has requested that you have a calcium score CT scan. There is a $99 fee for the scan.   Follow-Up: At [facility], you and your health needs are our priority.  As part of our continuing mission to provide you with exceptional heart care, we have created designated Provider Care Teams.  These Care Teams include your primary Cardiologist (physician) and Advanced Practice Providers (APPs -  Physician Assistants and Nurse Practitioners) who all work together to provide you with the care you need, when you need  it.  Your next appointment:   3 month(s)  The format for your next appointment:   In Person  Provider:   Fransico Him, MD     Other Instructions You have been referred to see an Endocrinologist, Dr. Chalmers Cater    Signed, Fransico Him, MD  05/13/2021 9:04 AM    [address], [place], [place]  [address] Phone: [phone]; Fax: [phone]

## 2021-05-16 ENCOUNTER — Telehealth: Payer: Self-pay | Admitting: *Deleted

## 2021-05-16 ENCOUNTER — Other Ambulatory Visit (HOSPITAL_COMMUNITY): Payer: Self-pay

## 2021-05-16 MED ORDER — NEBIVOLOL HCL 2.5 MG PO TABS
2.5000 mg | ORAL_TABLET | Freq: Every day | ORAL | 3 refills | Status: DC
  Filled 2021-05-16: qty 90, 90d supply, fill #0

## 2021-05-16 NOTE — Telephone Encounter (Signed)
-----   Message from Quintella Reichert, MD sent at 05/16/2021  2:16 PM EST ----- BP today 156/15mmHg and HR 106bpm.  Please increase Bystolic to 7.5mg  daily.  Order 2.5mg  tablets and have her take a 5mg  and 2.5mg  together daily.  BP and HR check x 1 week and call with results.

## 2021-05-21 ENCOUNTER — Other Ambulatory Visit: Payer: Self-pay

## 2021-05-21 ENCOUNTER — Ambulatory Visit (INDEPENDENT_AMBULATORY_CARE_PROVIDER_SITE_OTHER)
Admission: RE | Admit: 2021-05-21 | Discharge: 2021-05-21 | Disposition: A | Discharge status: Home / Self Care | Payer: Self-pay | Source: Ambulatory Visit | Attending: Cardiology | Admitting: Cardiology

## 2021-05-21 DIAGNOSIS — R000 Tachycardia, unspecified: Secondary | ICD-10-CM

## 2021-05-21 DIAGNOSIS — I493 Ventricular premature depolarization: Secondary | ICD-10-CM

## 2021-05-21 DIAGNOSIS — I10 Essential (primary) hypertension: Secondary | ICD-10-CM

## 2021-05-26 ENCOUNTER — Other Ambulatory Visit: Payer: Self-pay

## 2021-05-26 ENCOUNTER — Ambulatory Visit (HOSPITAL_COMMUNITY)
Admission: RE | Admit: 2021-05-26 | Discharge: 2021-05-26 | Disposition: A | Discharge status: Home / Self Care | Payer: 59 | Source: Ambulatory Visit | Attending: Cardiology | Admitting: Cardiology

## 2021-05-26 DIAGNOSIS — I493 Ventricular premature depolarization: Secondary | ICD-10-CM | POA: Diagnosis not present

## 2021-05-26 DIAGNOSIS — I10 Essential (primary) hypertension: Secondary | ICD-10-CM | POA: Diagnosis not present

## 2021-05-26 DIAGNOSIS — R000 Tachycardia, unspecified: Secondary | ICD-10-CM | POA: Diagnosis not present

## 2021-05-27 ENCOUNTER — Telehealth: Payer: Self-pay

## 2021-05-27 DIAGNOSIS — I701 Atherosclerosis of renal artery: Secondary | ICD-10-CM

## 2021-05-27 NOTE — Telephone Encounter (Signed)
-----   Message from Quintella Reichert, MD sent at 05/26/2021  5:50 PM EST ----- 1-59% bilateral renal artery stenosis.  Since she is having significant HTN I would like her to see Dr. Allyson Sabal just for evaulation

## 2021-05-27 NOTE — Telephone Encounter (Signed)
Pt has already viewed results comment and recommendation from Armanda Magic, MD via MyChart. Order placed at this time for referral to vascular consult for Dr Allyson Sabal.

## 2021-05-30 ENCOUNTER — Telehealth (INDEPENDENT_AMBULATORY_CARE_PROVIDER_SITE_OTHER): Payer: 59 | Admitting: Nurse Practitioner

## 2021-05-30 ENCOUNTER — Other Ambulatory Visit: Payer: Self-pay

## 2021-05-30 ENCOUNTER — Encounter: Payer: Self-pay | Admitting: Nurse Practitioner

## 2021-05-30 ENCOUNTER — Other Ambulatory Visit (HOSPITAL_COMMUNITY): Payer: Self-pay

## 2021-05-30 DIAGNOSIS — Z6841 Body Mass Index (BMI) 40.0 and over, adult: Secondary | ICD-10-CM | POA: Diagnosis not present

## 2021-05-30 DIAGNOSIS — F419 Anxiety disorder, unspecified: Secondary | ICD-10-CM | POA: Diagnosis not present

## 2021-05-30 DIAGNOSIS — F331 Major depressive disorder, recurrent, moderate: Secondary | ICD-10-CM | POA: Diagnosis not present

## 2021-05-30 DIAGNOSIS — E889 Metabolic disorder, unspecified: Secondary | ICD-10-CM | POA: Diagnosis not present

## 2021-05-30 DIAGNOSIS — R300 Dysuria: Secondary | ICD-10-CM | POA: Diagnosis not present

## 2021-05-30 MED ORDER — SEMAGLUTIDE-WEIGHT MANAGEMENT 0.25 MG/0.5ML ~~LOC~~ SOAJ
0.2500 mg | SUBCUTANEOUS | 0 refills | Status: DC
  Filled 2021-05-30 – 2021-06-06 (×2): qty 2, 28d supply, fill #0

## 2021-05-30 NOTE — Progress Notes (Addendum)
? ?Name: Haley Hall   MRN: [id number]    DOB: 09-Oct-1980   Date:05/30/2021 ? ?     Progress Note ? ?Subjective ? ?Chief Complaint ? ?Chief Complaint  ?Patient presents with  ? Follow-up  ?  Did not start medication  ? ? ?I connected with  Haley Hall  on 05/30/21 at 11:40 AM EST by a video enabled telemedicine application and verified that I am speaking with the correct person using two identifiers.  I discussed the limitations of evaluation and management by telemedicine and the availability of in person appointments. The patient expressed understanding and agreed to proceed with a virtual visit  Staff also discussed with the patient that there may be a patient responsible charge related to this service. ?Patient Location: work ?Provider Location: [facility] ?Additional Individuals present: alone ? ?HPI ? ?Anxiety and depression: She says that she did not start taking the zoloft because she didn't want to start a bunch of medications all at once. She says she really thinks her anxiety and depression is up because of daily stress. She is going to hold off on treatment for now.  ?Depression screen [facility] 2/9 05/30/2021 04/25/2021  ?Decreased Interest 1 1  ?Down, Depressed, Hopeless 1 1  ?PHQ - 2 Score 2 2  ?Altered sleeping 1 3  ?Tired, decreased energy 1 3  ?Change in appetite 1 0  ?Feeling bad or failure about yourself  1 1  ?Trouble concentrating 1 3  ?Moving slowly or fidgety/restless 1 1  ?Suicidal thoughts 0 0  ?PHQ-9 Score 8 13  ?Difficult doing work/chores Somewhat difficult Somewhat difficult  ?  ?GAD 7 : Generalized Anxiety Score 05/30/2021 04/25/2021  ?Nervous, Anxious, on Edge 1 2  ?Control/stop worrying 1 2  ?Worry too much - different things 1 2  ?Trouble relaxing 1 2  ?Restless 1 2  ?Easily annoyed or irritable 1 2  ?Afraid - awful might happen 1 2  ?Total GAD 7 Score 7 14  ?Anxiety Difficulty Somewhat difficult Somewhat difficult  ? ? Obesity/ metabolic disorder: Her current weight is 268 lbs.  Her A1C put  her in the prediabetic range. She would like to try weight loss medication. She has tried diet and exercise with little improvement.  Discussed wegovy and side effects.  She denies any personal history of pancreatitis and no family history of thyroid cancer. Will send in prescription for first month of wegovy. Follow up in 4 weeks after starting medication to see how she is tolerating it.  ? ?Dysuria: She say over the last few days she has had some dysuria.  She denies any back pain or fever.  She will collect a urine sample and drop off at [facility].  Will treat based on results. She did not report any vaginal discharge.  ? ?Patient Active Problem List  ? Diagnosis Date Noted  ? Class 3 severe obesity with serious comorbidity and body mass index (BMI) of 40.0 to 44.9 in adult [facility]) 04/25/2021  ? Tachycardia 04/25/2021  ? Anxiety and depression 04/25/2021  ? Hidradenitis suppurativa 10/24/2003  ? ? ?Social History  ? ?Tobacco Use  ? Smoking status: Some Days  ?  Types: E-cigarettes  ? Smokeless tobacco: Never  ?Substance Use Topics  ? Alcohol use: Yes  ?  Comment: Occ  ? ? ? ?Current Outpatient Medications:  ?  nebivolol (BYSTOLIC) 2.5 MG tablet, Take 1 tablet (2.5 mg total) by mouth daily. Take along with the 5mg  tablet., Disp: 90 tablet,  Rfl: 3 ?  nebivolol (BYSTOLIC) 5 MG tablet, Take 1 tablet (5 mg total) by mouth at bedtime., Disp: 90 tablet, Rfl: 3 ?  Semaglutide-Weight Management 0.25 MG/0.5ML SOAJ, Inject 0.25 mg into the skin once a week for 28 days., Disp: 2 mL, Rfl: 0 ?  sertraline (ZOLOFT) 25 MG tablet, Take 1 tablet (25 mg total) by mouth daily. (Patient not taking: Reported on 05/30/2021), Disp: 30 tablet, Rfl: 0 ? ?Allergies  ?Allergen Reactions  ? Other Anaphylaxis  ?  Uncoded Allergy. Allergen: FRESH FRUIT, RAW CARROTS, RAW CELERY  ? Penicillins Rash  ? ? ?I personally reviewed active problem list, medication list, allergies, notes from last encounter with the patient/caregiver  today. ? ?ROS ? ?Constitutional: Negative for fever or weight change.  ?Respiratory: Negative for cough and shortness of breath.   ?Cardiovascular: Negative for chest pain or palpitations.  ?Gastrointestinal: Negative for abdominal pain, no bowel changes.  ?GU: positive for dysuria ?Musculoskeletal: Negative for gait problem or joint swelling.  ?Skin: Negative for rash.  ?Neurological: Negative for dizziness or headache.  ?No other specific complaints in a complete review of systems (except as listed in HPI above).  ? ?Objective ? ?Virtual encounter, vitals not obtained. ? ?There is no height or weight on file to calculate BMI. ? ?Nursing Note and Vital Signs reviewed. ? ?Physical Exam ? ?Awake, alert and oriented, speaking in complete sentences ? ?No results found for this or any previous visit (from the past 72 hour(s)). ? ?Assessment & Plan ? ?1. Anxiety ?-continue to monitor symptoms, let me know if you start zoloft ? ?2. Moderate episode of recurrent major depressive disorder (HCC) ? ?-continue to monitor symptoms, let me know if you start zoloft ?3. Class 3 severe obesity with serious comorbidity and body mass index (BMI) of 40.0 to 44.9 in adult, unspecified obesity type (HCC) ? ?- Semaglutide-Weight Management 0.25 MG/0.5ML SOAJ; Inject 0.25 mg into the skin once a week for 28 days.  Dispense: 2 mL; Refill: 0 ? ?4. Metabolic disorder ? ?- Semaglutide-Weight Management 0.25 MG/0.5ML SOAJ; Inject 0.25 mg into the skin once a week for 28 days.  Dispense: 2 mL; Refill: 0 ? ?5. Dysuria ? ?- Urinalysis, Complete ?- Urine Culture  ? ? ?-Red flags and when to present for emergency care or RTC including fever >101.78F, chest pain, shortness of breath, new/worsening/un-resolving symptoms,  reviewed with patient at time of visit. Follow up and care instructions discussed and provided in AVS. ?- I discussed the assessment and treatment plan with the patient. The patient was provided an opportunity to ask questions and  all were answered. The patient agreed with the plan and demonstrated an understanding of the instructions. ? ?I provided 15 minutes of non-face-to-face time during this encounter. ? ?Berniece Salines, FNP ? ?  ? ? ? ?

## 2021-06-04 ENCOUNTER — Encounter: Payer: Self-pay | Admitting: Cardiology

## 2021-06-05 MED ORDER — NEBIVOLOL HCL 10 MG PO TABS: 10.0000 mg | ORAL_TABLET | Freq: Every day | ORAL | 3 refills | Status: DC

## 2021-06-05 NOTE — Telephone Encounter (Signed)
BP and HR Readings/Bystolic Dose Change ?Received: Today ?Quintella Reichert, MD  Theresia Majors, RN; Lattie Haw, RN ?Discussed with [place] and her DBP is still above goal of < 70.  Please increase Bystolic to 10mg  daily and check BP and HR daily for a week and call with results  ? ? ?Med change for increased Bystolic to 10 mg po daily at bedtime reflected.  Note placed to the pharmacy that pt will call for refills and use her current supply on hand first.  ?Pt aware of BP monitoring and will endorse values to Dr. in a week.  ?

## 2021-06-06 ENCOUNTER — Other Ambulatory Visit (HOSPITAL_COMMUNITY): Payer: Self-pay

## 2021-06-29 ENCOUNTER — Encounter: Payer: Self-pay | Admitting: Nurse Practitioner

## 2021-06-30 ENCOUNTER — Ambulatory Visit: Payer: 59 | Admitting: Dermatology

## 2021-06-30 ENCOUNTER — Other Ambulatory Visit: Payer: Self-pay | Admitting: Nurse Practitioner

## 2021-06-30 ENCOUNTER — Other Ambulatory Visit (HOSPITAL_COMMUNITY): Payer: Self-pay

## 2021-06-30 DIAGNOSIS — L732 Hidradenitis suppurativa: Secondary | ICD-10-CM | POA: Diagnosis not present

## 2021-06-30 DIAGNOSIS — L700 Acne vulgaris: Secondary | ICD-10-CM

## 2021-06-30 MED ORDER — CLINDAMYCIN PHOS-BENZOYL PEROX 1-5 % EX GEL
1.0000 "application " | Freq: Every morning | CUTANEOUS | 3 refills | Status: DC
  Filled 2021-06-30: qty 50, 30d supply, fill #0
  Filled 2021-06-30: qty 50, 50d supply, fill #0

## 2021-06-30 MED ORDER — DOXYCYCLINE HYCLATE 50 MG PO CAPS
50.0000 mg | ORAL_CAPSULE | Freq: Two times a day (BID) | ORAL | 0 refills | Status: DC
  Filled 2021-06-30: qty 60, 30d supply, fill #0

## 2021-06-30 MED ORDER — CLINDAMYCIN PHOS-BENZOYL PEROX 1.2-5 % EX GEL
CUTANEOUS | 3 refills | Status: DC
  Filled 2021-06-30: qty 45, 14d supply, fill #0

## 2021-06-30 MED ORDER — SEMAGLUTIDE-WEIGHT MANAGEMENT 0.5 MG/0.5ML ~~LOC~~ SOAJ: 0.5000 mg | SUBCUTANEOUS | 0 refills | Status: DC

## 2021-06-30 NOTE — Patient Instructions (Signed)
Doxycycline should be taken with food to prevent nausea. Do not lay down for 30 minutes after taking. Be cautious with sun exposure and use good sun protection while on this medication. Pregnant women should not take this medication.      If You Need Anything After Your Visit  If you have any questions or concerns for your doctor, please call our main line at [phone] and press option 4 to reach your doctor's medical assistant. If no one answers, please leave a voicemail as directed and we will return your call as soon as possible. Messages left after 4 pm will be answered the following business day.   You may also send us a message via MyChart. We typically respond to MyChart messages within 1-2 business days.  For prescription refills, please ask your pharmacy to contact our office. Our fax number is [phone].  If you have an urgent issue when the clinic is closed that cannot wait until the next business day, you can page your doctor at the number below.    Please note that while we do our best to be available for urgent issues outside of office hours, we are not available 24/7.   If you have an urgent issue and are unable to reach us, you may choose to seek medical care at your doctor's office, retail clinic, urgent care center, or emergency room.  If you have a medical emergency, please immediately call 911 or go to the emergency department.  Pager Numbers  - Dr. Kowalski: [phone]  - Dr. Moye: [phone]  - Dr. Stewart: [phone]  In the event of inclement weather, please call our main line at [phone] for an update on the status of any delays or closures.  Dermatology Medication Tips: Please keep the boxes that topical medications come in in order to help keep track of the instructions about where and how to use these. Pharmacies typically print the medication instructions only on the boxes and not directly on the medication tubes.   If your medication is  too expensive, please contact our office at [phone] option 4 or send us a message through MyChart.   We are unable to tell what your co-pay for medications will be in advance as this is different depending on your insurance coverage. However, we may be able to find a substitute medication at lower cost or fill out paperwork to get insurance to cover a needed medication.   If a prior authorization is required to get your medication covered by your insurance company, please allow us 1-2 business days to complete this process.  Drug prices often vary depending on where the prescription is filled and some pharmacies may offer cheaper prices.  The website www.goodrx.com contains coupons for medications through different pharmacies. The prices here do not account for what the cost may be with help from insurance (it may be cheaper with your insurance), but the website can give you the price if you did not use any insurance.  - You can print the associated coupon and take it with your prescription to the pharmacy.  - You may also stop by our office during regular business hours and pick up a GoodRx coupon card.  - If you need your prescription sent electronically to a different pharmacy, notify our office through [facility] MyChart or by phone at [phone] option 4.     Si Usted Necesita Algo Despus de Su Visita  Tambin puede enviarnos un mensaje a travs de MyChart. Por lo   general respondemos a los mensajes de MyChart en el transcurso de 1 a 2 das hbiles.  Para renovar recetas, por favor pida a su farmacia que se ponga en contacto con nuestra oficina. Nuestro nmero de fax es el [phone].  Si tiene un asunto urgente cuando la clnica est cerrada y que no puede esperar hasta el siguiente da hbil, puede llamar/localizar a su doctor(a) al nmero que aparece a continuacin.   Por favor, tenga en cuenta que aunque hacemos todo lo posible para estar disponibles para asuntos urgentes  fuera del horario de oficina, no estamos disponibles las 24 horas del da, los 7 das de la semana.   Si tiene un problema urgente y no puede comunicarse con nosotros, puede optar por buscar atencin mdica  en el consultorio de su doctor(a), en una clnica privada, en un centro de atencin urgente o en una sala de emergencias.  Si tiene una emergencia mdica, por favor llame inmediatamente al 911 o vaya a la sala de emergencias.  Nmeros de bper  - Dr. Kowalski: [phone]  - Dra. Moye: [phone]  - Dra. Stewart: [phone]  En caso de inclemencias del tiempo, por favor llame a nuestra lnea principal al [phone] para una actualizacin sobre el estado de cualquier retraso o cierre.  Consejos para la medicacin en dermatologa: Por favor, guarde las cajas en las que vienen los medicamentos de uso tpico para ayudarle a seguir las instrucciones sobre dnde y cmo usarlos. Las farmacias generalmente imprimen las instrucciones del medicamento slo en las cajas y no directamente en los tubos del medicamento.   Si su medicamento es muy caro, por favor, pngase en contacto con nuestra oficina llamando al [phone] y presione la opcin 4 o envenos un mensaje a travs de MyChart.   No podemos decirle cul ser su copago por los medicamentos por adelantado ya que esto es diferente dependiendo de la cobertura de su seguro. Sin embargo, es posible que podamos encontrar un medicamento sustituto a menor costo o llenar un formulario para que el seguro cubra el medicamento que se considera necesario.   Si se requiere una autorizacin previa para que su compaa de seguros cubra su medicamento, por favor permtanos de 1 a 2 das hbiles para completar este proceso.  Los precios de los medicamentos varan con frecuencia dependiendo del lugar de dnde se surte la receta y alguna farmacias pueden ofrecer precios ms baratos.  El sitio web www.goodrx.com tiene cupones para medicamentos de  diferentes farmacias. Los precios aqu no tienen en cuenta lo que podra costar con la ayuda del seguro (puede ser ms barato con su seguro), pero el sitio web puede darle el precio si no utiliz ningn seguro.  - Puede imprimir el cupn correspondiente y llevarlo con su receta a la farmacia.  - Tambin puede pasar por nuestra oficina durante el horario de atencin regular y recoger una tarjeta de cupones de GoodRx.  - Si necesita que su receta se enve electrnicamente a una farmacia diferente, informe a nuestra oficina a travs de MyChart de [facility] o por telfono llamando al [phone] y presione la opcin 4.  

## 2021-06-30 NOTE — Progress Notes (Signed)
? ?New Patient Visit ? ?Subjective  ?Haley Hall is a 41 y.o. female who presents for the following: HS  (B/L axilla, thighs - diagnosed in 2009, currently using Chlorhexidine wash, but in the past has used topical antibiotics. Nothing tried in the past has really helped significantly. She did use Benzaclin in the past with some improvement. Patient stopped wearing deodorant about 2 years ago since it clogged her pores under the arms, she quit smoking, has tried to lose weight but it is difficult to exercise due to painful lesions, she has also modified her diet, and has tried [facility] to see if that helped. ). ? ?The following portions of the chart were reviewed this encounter and updated as appropriate:  ? Tobacco  Allergies  Meds  Problems  Med Hx  Surg Hx  Fam Hx   ?  ?Review of Systems:  No other skin or systemic complaints except as noted in HPI or Assessment and Plan. ? ?Objective  ?Well appearing patient in no apparent distress; mood and affect are within normal limits. ? ?A focused examination was performed including the trunk and extremities . Relevant physical exam findings are noted in the Assessment and Plan. ? ?B/L axilla, thighs/groin ?Scarring fistula formation and erythema of the B/L axilla. Actively draining lesion on the L axilla. Similar in the groin.  ? ?Face ?Active papules of the face.  ? ? ?Assessment & Plan  ?Hidradenitis suppurativa ?B/L axilla, thighs/groin ? ?Hidradenitis Suppurativa is a chronic; persistent; non-curable, but treatable condition due to abnormal inflamed sweat glands in the body folds (axilla, inframammary, groin, medial thighs), causing recurrent painful draining cysts and scarring. It can be associated with severe scarring acne and cysts; also abscesses and scarring of scalp. The goal is control and prevention of flares, as it is not curable. Scars are permanent and can be thickened. Treatment may include daily use of topical medication and oral antibiotics.   Oral isotretinoin may also be helpful.  For more severe cases, Humira (a biologic injection) may be prescribed to decrease the inflammatory process and prevent flares.  When indicated, inflamed cysts may also be treated surgically. ? ?Continue Chlorhexidine wash daily in the shower. Avoid contact with the eyes.  ? ?Discussed oral antibiotics, Isotretinoin, surgical excision, and Humira injections.  ? ?Start Doxycycline 50mg  po QD. ?Start Duac gel nightly ? ? Doxycycline should be taken with food to prevent nausea. Do not lay down for 30 minutes after taking. Be cautious with sun exposure and use good sun protection while on this medication. Pregnant women should not take this medication.  ? ?Ultimately, recommend Isotretinoin and if not improved then consider Humira.  ? ?Start Duac gel Q at bedtime since Benzaclin gel not covered by insurance. Benzoyl peroxide can cause dryness and irritation of the skin. It can also bleach fabric. When used together with Aczone (dapsone) cream, it can stain the skin orange. ? ?doxycycline (VIBRAMYCIN) 50 MG capsule - B/L axilla, thighs/groin ?Take 1 capsule (50 mg total) by mouth 2 (two) times daily with food ? ?Clindamycin-Benzoyl Per, Refr, (DUAC) gel - B/L axilla, thighs/groin ?Apply a thin coat to the entire face, both armpits, and both thighs every morning. Benzoyl peroxide can cause dryness and irritation of the skin. It can also bleach fabric. ? ?Acne vulgaris ?Face ?See treatment for HS (Doxycycline, Benzaclin gel).  ? ?Return in about 2 months (around 08/30/2021) for HS f/u . ? ?I8/05/2021, CMA, am acting as scribe for Cari Caraway, MD . ?  Documentation: I have reviewed the above documentation for accuracy and completeness, and I agree with the above. ? ?Armida Sans, MD ? ? ?

## 2021-07-01 ENCOUNTER — Encounter: Payer: Self-pay | Admitting: Dermatology

## 2021-07-02 ENCOUNTER — Other Ambulatory Visit (HOSPITAL_COMMUNITY): Payer: Self-pay

## 2021-07-04 ENCOUNTER — Other Ambulatory Visit (HOSPITAL_COMMUNITY): Payer: Self-pay

## 2021-07-04 ENCOUNTER — Other Ambulatory Visit: Payer: Self-pay | Admitting: Nurse Practitioner

## 2021-07-04 MED ORDER — SEMAGLUTIDE-WEIGHT MANAGEMENT 0.5 MG/0.5ML ~~LOC~~ SOAJ
0.5000 mg | SUBCUTANEOUS | 0 refills | Status: DC
  Filled 2021-07-04: qty 2, 28d supply, fill #0

## 2021-07-08 ENCOUNTER — Encounter: Payer: Self-pay | Admitting: Dermatology

## 2021-08-03 ENCOUNTER — Encounter: Payer: Self-pay | Admitting: Nurse Practitioner

## 2021-08-04 ENCOUNTER — Other Ambulatory Visit: Payer: Self-pay | Admitting: Nurse Practitioner

## 2021-08-04 ENCOUNTER — Other Ambulatory Visit (HOSPITAL_COMMUNITY): Payer: Self-pay

## 2021-08-04 MED ORDER — SEMAGLUTIDE-WEIGHT MANAGEMENT 1 MG/0.5ML ~~LOC~~ SOAJ
1.0000 mg | SUBCUTANEOUS | 0 refills | Status: DC
  Filled 2021-08-04: qty 2, 28d supply, fill #0

## 2021-08-07 ENCOUNTER — Other Ambulatory Visit (HOSPITAL_COMMUNITY): Payer: Self-pay

## 2021-08-07 ENCOUNTER — Other Ambulatory Visit: Payer: Self-pay | Admitting: *Deleted

## 2021-08-07 ENCOUNTER — Other Ambulatory Visit: Payer: Self-pay | Admitting: Cardiology

## 2021-08-07 MED ORDER — NEBIVOLOL HCL 10 MG PO TABS
10.0000 mg | ORAL_TABLET | Freq: Every day | ORAL | 3 refills | Status: AC
  Filled 2021-08-07: qty 90, 90d supply, fill #0
  Filled 2021-09-19: qty 45, 45d supply, fill #1

## 2021-08-12 ENCOUNTER — Encounter: Payer: Self-pay | Admitting: Cardiology

## 2021-08-12 ENCOUNTER — Ambulatory Visit: Payer: 59 | Admitting: Cardiology

## 2021-08-12 VITALS — BP 136/84 | HR 91 | Ht 66.0 in | Wt 260.0 lb

## 2021-08-12 DIAGNOSIS — R7303 Prediabetes: Secondary | ICD-10-CM

## 2021-08-12 DIAGNOSIS — I10 Essential (primary) hypertension: Secondary | ICD-10-CM | POA: Diagnosis not present

## 2021-08-12 DIAGNOSIS — I493 Ventricular premature depolarization: Secondary | ICD-10-CM | POA: Diagnosis not present

## 2021-08-12 DIAGNOSIS — R000 Tachycardia, unspecified: Secondary | ICD-10-CM | POA: Diagnosis not present

## 2021-08-12 DIAGNOSIS — Z8249 Family history of ischemic heart disease and other diseases of the circulatory system: Secondary | ICD-10-CM

## 2021-08-12 NOTE — Addendum Note (Signed)
Addended by: Fransico Him R on: 08/12/2021 10:14 AM ? ? Modules accepted: Orders ? ?

## 2021-08-12 NOTE — Addendum Note (Signed)
Addended by: Theresia Majors on: 08/12/2021 09:51 AM ? ? Modules accepted: Orders ? ?

## 2021-08-12 NOTE — Patient Instructions (Signed)
Medication Instructions:  ?Your physician recommends that you continue on your current medications as directed. Please refer to the Current Medication list given to you today. ? ?*If you need a refill on your cardiac medications before your next appointment, please call your pharmacy* ? ?Lab Work: ?3 MONTHS: FLP and ALT  ?If you have labs (blood work) drawn today and your tests are completely normal, you will receive your results only by: ?MyChart Message (if you have MyChart) OR ?A paper copy in the mail ?If you have any lab test that is abnormal or we need to change your treatment, we will call you to review the results. ? ?Testing/Procedures: ?Your physician has requested that you have an exercise tolerance test. For further information please visit [email or web address] Please also follow instruction sheet, as given. ? ?Follow-Up: ?At [facility], you and your health needs are our priority.  As part of our continuing mission to provide you with exceptional heart care, we have created designated Provider Care Teams.  These Care Teams include your primary Cardiologist (physician) and Advanced Practice Providers (APPs -  Physician Assistants and Nurse Practitioners) who all work together to provide you with the care you need, when you need it. ? ?Your next appointment:   ?6 month(s) ? ?The format for your next appointment:   ?In Person ? ?Provider:   ?Armanda Magic, MD   ? ?Important Information About Sugar ? ? ? ? ?  ?

## 2021-08-12 NOTE — Addendum Note (Signed)
Addended by: Theresia Majors on: 08/12/2021 09:50 AM ? ? Modules accepted: Orders ? ?

## 2021-08-12 NOTE — Progress Notes (Signed)
? ?Cardiology CONSULT Note   ? ?Date:  08/12/2021  ? ?ID:  Haley DoffingBrittany N Corcoran, DOB 03/01/1981, MRN [id number] ? ?PCP:  Berniece SalinesPender, Julie F, FNP  ?Cardiologist:  Armanda Magicraci Calynn Ferrero, MD  ? ?Chief Complaint  ?Patient presents with  ? Follow-up  ?  Hypertension, inappropriate sinus tachycardia, PVCs  ? ? ?History of Present Illness:  ?Haley Hall is a 41 y.o. female  with a history of asthma and hidradenitis who has been experiencing problems with palpitations recently.  With resting heart rates consistently above 100 bpm even at rest.  She recently wore an event monitor which showed normal sinus rhythm and sinus tachycardia with average heart rate 101 bpm and occasional PVCs, bigeminal and trigeminal PVCs, ventricular couplets and triplets.  Her PVC load was less than 1%.  She is also been having problems with hypertension with significantly elevated blood pressures.  24-hour urine for catecholamines/VMA, metanephrines and dopamine was normal.  Plasma renin and aldosterone levels as well as PRA were normal and TSH was normal she has a renal duplex pending to rule out renal artery stenosis.  She underwent home sleep study which showed no evidence of sleep apnea.  SInce I saw her last her mom was dx with CAD with 99% LAD and underwent PCI. She is 39yo. ? ?She is here today for followup and is doing well.  She denies any chest pain or pressure, SOB, DOE, PND, orthopnea, LE edema, dizziness, palpitations or syncope. She is compliant with her meds and is tolerating meds with no SE.    ? ?Past Medical History:  ?Diagnosis Date  ? Asthma   ? Hidradenitis   ? ? ?Past Surgical History:  ?Procedure Laterality Date  ? DILATION AND CURETTAGE OF UTERUS    ? SAB  ? TONSILLECTOMY    ? ? ?Current Medications: ?Current Meds  ?Medication Sig  ? Clindamycin-Benzoyl Per, Refr, (DUAC) gel Apply a thin coat to the entire face, both armpits, and both thighs every morning. Benzoyl peroxide can cause dryness and irritation of the skin. It can also  bleach fabric.  ? doxycycline (VIBRAMYCIN) 50 MG capsule Take 1 capsule (50 mg total) by mouth 2 (two) times daily with food  ? nebivolol (BYSTOLIC) 10 MG tablet Take 1 tablet (10 mg total) by mouth at bedtime.  ? [START ON 10/01/2021] Semaglutide-Weight Management 1 MG/0.5ML SOAJ Inject 1 mg into the skin once a week for 28 days.  ? ? ?Allergies:   Other and Penicillins  ? ?Social History  ? ?Socioeconomic History  ? Marital status: Married  ?  Spouse name: Not on file  ? Number of children: Not on file  ? Years of education: Not on file  ? Highest education level: Not on file  ?Occupational History  ? Occupation: [employer]  ?Tobacco Use  ? Smoking status: Some Days  ?  Types: E-cigarettes  ? Smokeless tobacco: Never  ?Vaping Use  ? Vaping Use: Every day  ?Substance and Sexual Activity  ? Alcohol use: Yes  ?  Comment: Occ  ? Drug use: Never  ? Sexual activity: Yes  ?  Partners: Male  ?  Birth control/protection: I.U.D.  ?Other Topics Concern  ? Not on file  ?Social History Narrative  ? Not on file  ? ?Social Determinants of Health  ? ?Financial Resource Strain: Not on file  ?Food Insecurity: Not on file  ?Transportation Needs: Not on file  ?Physical Activity: Not on file  ?Stress: Not on file  ?  Social Connections: Not on file  ?  ? ?Family History:  The patient's family history includes ADD / ADHD in her sister; Hypertension in her mother; Lung cancer in her maternal grandfather.  ? ?ROS:   ?Please see the history of present illness.    ?ROS All other systems reviewed and are negative. ? ?   ? View : No data to display.  ?  ?  ?  ? ? ? ? ? ?PHYSICAL EXAM:   ?VS:  BP 136/84   Pulse 91   Ht 5\' 6"  (1.676 m)   Wt 260 lb (117.9 kg)   SpO2 99%   BMI 41.97 kg/m?    ?GEN: Well nourished, well developed in no acute distress ?HEENT: Normal ?NECK: No JVD; No carotid bruits ?LYMPHATICS: No lymphadenopathy ?CARDIAC:RRR, no murmurs, rubs, gallops ?RESPIRATORY:  Clear to auscultation without rales, wheezing or rhonchi   ?ABDOMEN: Soft, non-tender, non-distended ?MUSCULOSKELETAL:  No edema; No deformity  ?SKIN: Warm and dry ?NEUROLOGIC:  Alert and oriented x 3 ?PSYCHIATRIC:  Normal affect   ? ?Wt Readings from Last 3 Encounters:  ?08/12/21 260 lb (117.9 kg)  ?05/13/21 268 lb 3.2 oz (121.7 kg)  ?04/25/21 265 lb (120.2 kg)  ?  ? ? ?Studies/Labs Reviewed:  ? ?EKG:  EKG is not ordered today.   ? ?Recent Labs: ?04/25/2021: ALT 10; BUN 9; Creat 0.63; Hemoglobin 12.5; Platelets 375; Potassium 4.4; Sodium 139; TSH 1.73  ? ?Lipid Panel ?   ?Component Value Date/Time  ? CHOL 158 04/25/2021 1130  ? TRIG 74 04/25/2021 1130  ? HDL 49 (L) 04/25/2021 1130  ? CHOLHDL 3.2 04/25/2021 1130  ? LDLCALC 93 04/25/2021 1130  ? ? ? ?Additional studies/ records that were reviewed today include:  ?2D echo, labs, event monitor ? ? ? ?ASSESSMENT:   ? ?1. Hypertension, unspecified type   ?2. Inappropriate sinus tachycardia   ?3. PVC (premature ventricular contraction)   ?4. Pre-diabetes   ? ? ? ?PLAN:  ?In order of problems listed above: ? ?HTN ?-Suspect this is essential hypertension as we have ruled out all secondary causes of hypertension except for renal artery stenosis and she has a renal duplex pending ?-she has had problems with sleepiness with the Toprol even taking it at night so this was changed to Bystolic ?-BP is well controlled on exam today ?-Continue prescription drug management Bystolic 10 mg daily with as needed refills ? ?2.  Inappropriate sinus tachycardia ?-Orthostatic blood pressures did not show any marked increase in tachycardia or drop in blood pressure so I do not think this is POTS ?-Event monitor showed an average heart rate of 101 bpm and a range of 53 to 160 bpm ?-Heart rate and palpitations have significantly improved on Bystolic which she will continue 10 mg daily ? ?3.  PVCs ?-She is fairly asymptomatic with these ?-2D echo showed normal LV function with EF 60 to 65% and normal global longitudinal LV strain at -20.9%. ?-TSH and  bmet were normal ?-Continue prescription drug management with Bystolic 10 mg daily with as needed refills ?-Coronary calcium score was 0 ?-will repeat coronary Ca score in 5 years due to premature CAD in her mom ? ?4.  Elevated HbA1C ?-she is borderline pre diabetic and morbidly obese ?-She is on weight loss injections and has lost 8lbs so far ?-repeat HbA1C after she has lost more weight ?Shared Decision Making/Informed Consent ?The risks [chest pain, shortness of breath, cardiac arrhythmias, dizziness, blood pressure fluctuations, myocardial infarction, stroke/transient  ischemic attack, and life-threatening complications (estimated to be 1 in 10,000)], benefits (risk stratification, diagnosing coronary artery disease, treatment guidance) and alternatives of an exercise tolerance test were discussed in detail with Ms. Smoak and she agrees to proceed. ?-her mom had an MI with 99% LAD in April at age 90 ?-her coronary ca score was 0 but does not rule out noncalcified plaque ?-she has vascular disease with RAS ?-will get an ETT to rule out ischemia and if normal repeat in 5 years ? ?5.  HLD ?-LDL goal < 70 due to her RAS ?-will see how weight loss affects her LDL which is at 93 ?-repeat FLP and ALT in 3 months ? ? ?Medication Adjustments/Labs and Tests Ordered: ?Current medicines are reviewed at length with the patient today.  Concerns regarding medicines are outlined above.  Medication changes, Labs and Tests ordered today are listed in the Patient Instructions below. ? ?There are no Patient Instructions on file for this visit. ? ? ?Signed, ?Armanda Magic, MD  ?08/12/2021 9:31 AM    ?[facility] ?[address] [place], [place], [place]  [address] ?Phone: [phone]; Fax: [phone]  ? ?

## 2021-09-04 ENCOUNTER — Encounter: Payer: Self-pay | Admitting: Nurse Practitioner

## 2021-09-05 ENCOUNTER — Other Ambulatory Visit: Payer: Self-pay | Admitting: Nurse Practitioner

## 2021-09-05 ENCOUNTER — Other Ambulatory Visit (HOSPITAL_COMMUNITY): Payer: Self-pay

## 2021-09-05 MED ORDER — SEMAGLUTIDE-WEIGHT MANAGEMENT 1.7 MG/0.75ML ~~LOC~~ SOAJ
1.7000 mg | SUBCUTANEOUS | 0 refills | Status: DC
  Filled 2021-09-05 – 2021-09-15 (×3): qty 3, 28d supply, fill #0

## 2021-09-05 MED ORDER — SEMAGLUTIDE-WEIGHT MANAGEMENT 2.4 MG/0.75ML ~~LOC~~ SOAJ
2.4000 mg | SUBCUTANEOUS | 0 refills | Status: AC
  Filled 2021-09-05 – 2021-10-09 (×3): qty 3, 28d supply, fill #0

## 2021-09-08 ENCOUNTER — Ambulatory Visit: Payer: 59 | Admitting: Dermatology

## 2021-09-08 ENCOUNTER — Other Ambulatory Visit (HOSPITAL_COMMUNITY): Payer: Self-pay

## 2021-09-08 ENCOUNTER — Telehealth: Payer: Self-pay | Admitting: Nurse Practitioner

## 2021-09-08 NOTE — Telephone Encounter (Signed)
PA sent to cover my meds

## 2021-09-08 NOTE — Telephone Encounter (Signed)
Pt sent a message last week about her [employer] RX needing a PA/ she was due for her next dose on 6.9.23/ and is no past due / she wanted to know if the PA was done or being worked on and didn't want to have any adverse effects from missing dose too long / please advise asap / pt stated she is ok with messages on mychart

## 2021-09-11 ENCOUNTER — Ambulatory Visit (INDEPENDENT_AMBULATORY_CARE_PROVIDER_SITE_OTHER): Payer: 59

## 2021-09-11 DIAGNOSIS — I10 Essential (primary) hypertension: Secondary | ICD-10-CM

## 2021-09-11 DIAGNOSIS — I493 Ventricular premature depolarization: Secondary | ICD-10-CM

## 2021-09-11 DIAGNOSIS — R7303 Prediabetes: Secondary | ICD-10-CM | POA: Diagnosis not present

## 2021-09-11 DIAGNOSIS — R000 Tachycardia, unspecified: Secondary | ICD-10-CM | POA: Diagnosis not present

## 2021-09-11 DIAGNOSIS — I4711 Inappropriate sinus tachycardia, so stated: Secondary | ICD-10-CM

## 2021-09-11 LAB — EXERCISE TOLERANCE TEST
Angina Index: 0
Duke Treadmill Score: 6
Estimated workload: 7.7
Exercise duration (min): 6 min
Exercise duration (sec): 28 s
MPHR: 182 {beats}/min
Peak HR: 176 {beats}/min
Percent HR: 96 %
RPE: 17
Rest HR: 96 {beats}/min
ST Depression (mm): 0 mm

## 2021-09-12 ENCOUNTER — Telehealth: Payer: Self-pay | Admitting: Nurse Practitioner

## 2021-09-12 ENCOUNTER — Other Ambulatory Visit (HOSPITAL_COMMUNITY): Payer: Self-pay

## 2021-09-12 NOTE — Telephone Encounter (Signed)
Pt is calling regarding PA for Rx #: [id number]  Semaglutide-Weight Management 1.7 MG/0.75ML SOAJ [[id number]] . Pharmacy stated that the PA was approved for the 2.4 instead of the 1.7. Pt is calling to ask Myriam Jacobson was the PA sent in for the 1.7? Please advise [phone]

## 2021-09-15 ENCOUNTER — Other Ambulatory Visit (HOSPITAL_COMMUNITY): Payer: Self-pay

## 2021-09-15 NOTE — Telephone Encounter (Signed)
On cover my med 1.7 was approved

## 2021-09-15 NOTE — Telephone Encounter (Signed)
Spoke to patient and she will check with pharmacy

## 2021-09-19 ENCOUNTER — Other Ambulatory Visit (HOSPITAL_COMMUNITY): Payer: Self-pay

## 2021-10-09 ENCOUNTER — Other Ambulatory Visit (HOSPITAL_COMMUNITY): Payer: Self-pay

## 2021-11-18 ENCOUNTER — Other Ambulatory Visit: Payer: Self-pay | Admitting: Nurse Practitioner

## 2021-11-18 ENCOUNTER — Encounter: Payer: Self-pay | Admitting: Nurse Practitioner

## 2021-11-18 ENCOUNTER — Other Ambulatory Visit (HOSPITAL_COMMUNITY): Payer: Self-pay

## 2021-11-18 MED ORDER — SEMAGLUTIDE-WEIGHT MANAGEMENT 2.4 MG/0.75ML ~~LOC~~ SOAJ
2.4000 mg | SUBCUTANEOUS | 3 refills | Status: DC
  Filled 2021-11-18: qty 3, 28d supply, fill #0
  Filled 2021-12-26: qty 3, 28d supply, fill #1
  Filled 2022-02-10: qty 3, 28d supply, fill #2

## 2021-12-26 ENCOUNTER — Other Ambulatory Visit (HOSPITAL_COMMUNITY): Payer: Self-pay

## 2022-01-07 ENCOUNTER — Ambulatory Visit: Payer: 59 | Admitting: Dermatology

## 2022-02-10 ENCOUNTER — Other Ambulatory Visit (HOSPITAL_COMMUNITY): Payer: Self-pay

## 2022-02-10 ENCOUNTER — Encounter: Payer: Self-pay | Admitting: Nurse Practitioner

## 2022-02-17 ENCOUNTER — Other Ambulatory Visit (HOSPITAL_COMMUNITY): Payer: Self-pay

## 2022-02-26 ENCOUNTER — Ambulatory Visit: Payer: 59 | Attending: Cardiology

## 2022-02-26 ENCOUNTER — Ambulatory Visit: Payer: 59 | Admitting: Cardiology

## 2022-02-26 DIAGNOSIS — I4711 Inappropriate sinus tachycardia, so stated: Secondary | ICD-10-CM | POA: Diagnosis not present

## 2022-02-26 DIAGNOSIS — I493 Ventricular premature depolarization: Secondary | ICD-10-CM | POA: Diagnosis not present

## 2022-02-26 DIAGNOSIS — I10 Essential (primary) hypertension: Secondary | ICD-10-CM

## 2022-02-26 DIAGNOSIS — R7303 Prediabetes: Secondary | ICD-10-CM

## 2022-02-26 LAB — LIPID PANEL
Chol/HDL Ratio: 3.2 ratio (ref 0.0–4.4)
Cholesterol, Total: 172 mg/dL (ref 100–199)
HDL: 53 mg/dL (ref >39)
LDL Chol Calc (NIH): 105 mg/dL — ABNORMAL HIGH (ref 0–99)
Triglycerides: 77 mg/dL (ref 0–149)
VLDL Cholesterol Cal: 14 mg/dL (ref 5–40)

## 2022-02-26 LAB — ALT: ALT: 31 IU/L (ref 0–32)

## 2022-02-26 NOTE — Progress Notes (Signed)
Cardiology Note    Date:  02/27/2022   ID:  Haley Hall, DOB [birth year], MRN [id number]  PCP:  Berniece Salines, FNP  Cardiologist:  Armanda Magic, MD   Chief Complaint  Patient presents with   Follow-up    HTN, elevated HbA1C, PVCs, sinus tach, RAS, HLD    History of Present Illness:  Haley Hall is a 41 y.o. female  with a history of asthma and hidradenitis who has been experiencing problems with palpitations recently with resting heart rates consistently above 100 bpm even at rest.  She recently wore an event monitor which showed normal sinus rhythm and sinus tachycardia with average heart rate 101 bpm and occasional PVCs, bigeminal and trigeminal PVCs, ventricular couplets and triplets.  Her PVC load was less than 1%.  She is also had been having problems with hypertension with significantly elevated blood pressures.  24-hour urine for catecholamines/VMA, metanephrines and dopamine was normal.  Plasma renin and aldosterone levels as well as PRA were normal and TSH was normal.  Renal dopplers showed 1-59% bilateral renal artery stenosis.  She underwent home sleep study which showed no evidence of sleep apnea.  She has a fm hx of CAD with her mom recently having 99% LAD and underwent PCI.  She was initially on Toprol but had problems with sleepiness even when taking at night and was changed with Bystolic 10mg  daily for inappropriate sinus tach along with PVCs.  Orthostatic BPs were normal.  Coronary Ca score was 0.  She was placed on Wegovy and has lost 32 lbs.   She is here today for followup and is doing well.  She denies any chest pain or pressure, SOB, DOE, PND, orthopnea, LE edema, dizziness or syncope.  Still has some palpitations but much better controlled on BB. She is compliant with her meds and is tolerating meds with no SE.    Past Medical History:  Diagnosis Date   Asthma    Hidradenitis     Past Surgical History:  Procedure Laterality Date   DILATION AND  CURETTAGE OF UTERUS     SAB   TONSILLECTOMY      Current Medications: Current Meds  Medication Sig   nebivolol (BYSTOLIC) 10 MG tablet Take 1 tablet (10 mg total) by mouth at bedtime.    Allergies:   Other and Penicillins   Social History   Socioeconomic History   Marital status: Married    Spouse name: Not on file   Number of children: Not on file   Years of education: Not on file   Highest education level: Not on file  Occupational History   Occupation: [employer]  Tobacco Use   Smoking status: Some Days    Types: E-cigarettes   Smokeless tobacco: Never  Vaping Use   Vaping Use: Every day  Substance and Sexual Activity   Alcohol use: Yes    Comment: Occ   Drug use: Never   Sexual activity: Yes    Partners: Male    Birth control/protection: I.U.D.  Other Topics Concern   Not on file  Social History Narrative   Not on file   Social Determinants of Health   Financial Resource Strain: Not on file  Food Insecurity: Not on file  Transportation Needs: Not on file  Physical Activity: Not on file  Stress: Not on file  Social Connections: Not on file     Family History:  The patient's family history includes ADD / ADHD in  her sister; Hypertension in her mother; Lung cancer in her maternal grandfather.   ROS:   Please see the history of present illness.    ROS All other systems reviewed and are negative.      No data to display             PHYSICAL EXAM:   VS:  BP 128/82   Pulse 75   Ht 5\' 6"  (1.676 m)   Wt 228 lb 6.4 oz (103.6 kg)   SpO2 98%   BMI 36.86 kg/m    GEN: Well nourished, well developed in no acute distress HEENT: Normal NECK: No JVD; No carotid bruits LYMPHATICS: No lymphadenopathy CARDIAC:RRR, no murmurs, rubs, gallops RESPIRATORY:  Clear to auscultation without rales, wheezing or rhonchi  ABDOMEN: Soft, non-tender, non-distended MUSCULOSKELETAL:  No edema; No deformity  SKIN: Warm and dry NEUROLOGIC:  Alert and oriented x  3 PSYCHIATRIC:  Normal affect  Wt Readings from Last 3 Encounters:  02/27/22 228 lb 6.4 oz (103.6 kg)  08/12/21 260 lb (117.9 kg)  05/13/21 268 lb 3.2 oz (121.7 kg)      Studies/Labs Reviewed:   EKG:  EKG is ordered today and demonstrates NSR with PVCs   Recent Labs: 04/25/2021: BUN 9; Creat 0.63; Hemoglobin 12.5; Platelets 375; Potassium 4.4; Sodium 139; TSH 1.73 02/26/2022: ALT 31   Lipid Panel    Component Value Date/Time   CHOL 172 02/26/2022 1144   TRIG 77 02/26/2022 1144   HDL 53 02/26/2022 1144   CHOLHDL 3.2 02/26/2022 1144   CHOLHDL 3.2 04/25/2021 1130   LDLCALC 105 (H) 02/26/2022 1144   LDLCALC 93 04/25/2021 1130     Additional studies/ records that were reviewed today include:  2D echo, labs, event monitor    ASSESSMENT:    1. Hypertension, unspecified type   2. Inappropriate sinus tachycardia   3. PVC (premature ventricular contraction)   4. Pre-diabetes   5. Renal artery stenosis (HCC)   6. Pure hypercholesterolemia      PLAN:  In order of problems listed above:  HTN -Suspect this is essential hypertension as we have ruled out all secondary causes of hypertension except for renal artery stenosis.  She does have mild renal artery stenosis bilaterally 1-59%. -she had problems with sleepiness with the Toprol even taking it at night so this was changed to Bystolic -BP has been very well controlled -continue prescription drug management with Bystolic 10mg  daily with PRN refills  2.  Inappropriate sinus tachycardia -Orthostatic blood pressures did not show any marked increase in tachycardia or drop in blood pressure so I do not think this is POTS -Event monitor showed an average heart rate of 101 bpm and a range of 53 to 160 bpm -Heart rate and palpitations have significantly improved on Bystolic  -continue Bystolic 10mg  daily with PRN refills  3.  PVCs -palpitations have resolved -2D echo showed normal LV function with EF 60 to 65% and normal  global longitudinal LV strain at -20.9%. -TSH and bmet were normal -Continue prescription drug management with Bystolic 10mg  daily with PRN refills -Coronary calcium score was 0 -will repeat coronary Ca score in 5 years due to premature CAD in her mom  4.  Elevated HbA1C -she is borderline pre diabetic and morbidly obese -She is on weight loss injections and has lost 32lbs so far -her mom had an MI with 99% LAD in April at age 66 -her coronary ca score was 0 but does not rule out noncalcified  plaque -she has vascular disease with RAS -ETT showed no ischemia -followed by PCP  5.  Renal artery stenosis -1-59% bilateral stenosis -repeat dopplers in 05/2023 -continue statin  6.  HLD -LDL goal < 70 due to her RAS -I have personally reviewed and interpreted outside labs performed by patient's PCP which showed LDL 105 and HDL 53 -will add Crestor 5mg  and start with 3 times weekly due to problems with body aches in general  and repeat FLP and ALT in 6 weeks  Medication Adjustments/Labs and Tests Ordered: Current medicines are reviewed at length with the patient today.  Concerns regarding medicines are outlined above.  Medication changes, Labs and Tests ordered today are listed in the Patient Instructions below.  There are no Patient Instructions on file for this visit.   Signed, , MD  02/27/2022 9:27 AM    [facility] [address] [place], [place], [place]  [place] Phone: [phone]; Fax: [phone]

## 2022-02-27 ENCOUNTER — Encounter: Payer: Self-pay | Admitting: Cardiology

## 2022-02-27 ENCOUNTER — Other Ambulatory Visit (HOSPITAL_COMMUNITY): Payer: Self-pay

## 2022-02-27 ENCOUNTER — Ambulatory Visit: Payer: 59 | Attending: Cardiology | Admitting: Cardiology

## 2022-02-27 VITALS — BP 128/82 | HR 75 | Ht 66.0 in | Wt 228.4 lb

## 2022-02-27 DIAGNOSIS — R7303 Prediabetes: Secondary | ICD-10-CM | POA: Diagnosis not present

## 2022-02-27 DIAGNOSIS — I4711 Inappropriate sinus tachycardia, so stated: Secondary | ICD-10-CM | POA: Diagnosis not present

## 2022-02-27 DIAGNOSIS — I493 Ventricular premature depolarization: Secondary | ICD-10-CM

## 2022-02-27 DIAGNOSIS — I10 Essential (primary) hypertension: Secondary | ICD-10-CM | POA: Diagnosis not present

## 2022-02-27 DIAGNOSIS — I701 Atherosclerosis of renal artery: Secondary | ICD-10-CM | POA: Diagnosis not present

## 2022-02-27 DIAGNOSIS — E7800 Pure hypercholesterolemia, unspecified: Secondary | ICD-10-CM

## 2022-02-27 MED ORDER — ROSUVASTATIN CALCIUM 5 MG PO TABS
5.0000 mg | ORAL_TABLET | ORAL | 3 refills | Status: DC
  Filled 2022-02-27 – 2022-03-19 (×2): qty 36, 84d supply, fill #0

## 2022-02-27 NOTE — Patient Instructions (Signed)
Medication Instructions:  Your physician has recommended you make the following change in your medication:  1.Start rosuvastatin (Crestor) 5 mg, 3 times weekly. *If you need a refill on your cardiac medications before your next appointment, please call your pharmacy*   Lab Work: Fasting lipids and ALT in 8 weeks If you have labs (blood work) drawn today and your tests are completely normal, you will receive your results only by: MyChart Message (if you have MyChart) OR A paper copy in the mail If you have any lab test that is abnormal or we need to change your treatment, we will call you to review the results.   Follow-Up: At [facility], you and your health needs are our priority.  As part of our continuing mission to provide you with exceptional heart care, we have created designated Provider Care Teams.  These Care Teams include your primary Cardiologist (physician) and Advanced Practice Providers (APPs -  Physician Assistants and Nurse Practitioners) who all work together to provide you with the care you need, when you need it.   Your next appointment:   1 year(s)  The format for your next appointment:   In Person  Provider:   Armanda Magic, MD    Important Information About Sugar

## 2022-02-27 NOTE — Addendum Note (Signed)
Addended by: Valrie Hart on: 02/27/2022 09:52 AM   Modules accepted: Orders

## 2022-03-02 ENCOUNTER — Other Ambulatory Visit (HOSPITAL_COMMUNITY): Payer: Self-pay

## 2022-03-11 ENCOUNTER — Other Ambulatory Visit (HOSPITAL_COMMUNITY): Payer: Self-pay

## 2022-03-17 ENCOUNTER — Other Ambulatory Visit (HOSPITAL_COMMUNITY): Payer: Self-pay

## 2022-03-18 ENCOUNTER — Other Ambulatory Visit (HOSPITAL_COMMUNITY): Payer: Self-pay

## 2022-03-19 ENCOUNTER — Other Ambulatory Visit (HOSPITAL_COMMUNITY): Payer: Self-pay

## 2022-04-03 ENCOUNTER — Other Ambulatory Visit (HOSPITAL_COMMUNITY): Payer: Self-pay
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
## 2022-08-19 ENCOUNTER — Ambulatory Visit (INDEPENDENT_AMBULATORY_CARE_PROVIDER_SITE_OTHER): Payer: BC Managed Care – PPO | Admitting: Obstetrics and Gynecology

## 2022-08-19 ENCOUNTER — Other Ambulatory Visit (HOSPITAL_COMMUNITY)
Admission: RE | Admit: 2022-08-19 | Discharge: 2022-08-19 | Disposition: A | Discharge status: Home / Self Care | Payer: BC Managed Care – PPO | Source: Ambulatory Visit | Attending: Obstetrics and Gynecology | Admitting: Obstetrics and Gynecology

## 2022-08-19 ENCOUNTER — Encounter: Payer: Self-pay | Admitting: Obstetrics and Gynecology

## 2022-08-19 VITALS — BP 130/89 | HR 96 | Resp 16 | Ht 66.0 in | Wt 223.9 lb

## 2022-08-19 DIAGNOSIS — Z01419 Encounter for gynecological examination (general) (routine) without abnormal findings: Secondary | ICD-10-CM

## 2022-08-19 DIAGNOSIS — Z124 Encounter for screening for malignant neoplasm of cervix: Secondary | ICD-10-CM

## 2022-08-19 DIAGNOSIS — Z113 Encounter for screening for infections with a predominantly sexual mode of transmission: Secondary | ICD-10-CM

## 2022-08-19 DIAGNOSIS — Z13220 Encounter for screening for lipoid disorders: Secondary | ICD-10-CM

## 2022-08-19 DIAGNOSIS — Z3202 Encounter for pregnancy test, result negative: Secondary | ICD-10-CM | POA: Diagnosis not present

## 2022-08-19 DIAGNOSIS — Z131 Encounter for screening for diabetes mellitus: Secondary | ICD-10-CM

## 2022-08-19 DIAGNOSIS — Z3200 Encounter for pregnancy test, result unknown: Secondary | ICD-10-CM

## 2022-08-19 DIAGNOSIS — Z1231 Encounter for screening mammogram for malignant neoplasm of breast: Secondary | ICD-10-CM

## 2022-08-19 LAB — POCT URINE PREGNANCY: Preg Test, Ur: NEGATIVE

## 2022-08-19 MED ORDER — SLYND 4 MG PO TABS: 1.0000 | ORAL_TABLET | Freq: Every day | ORAL | 3 refills | Status: DC

## 2022-08-19 NOTE — Patient Instructions (Signed)
Preventive Care 21-39 Years Old, Female Preventive care refers to lifestyle choices and visits with your health care provider that can promote health and wellness. Preventive care visits are also called wellness exams. What can I expect for my preventive care visit? Counseling During your preventive care visit, your health care provider may ask about your: Medical history, including: Past medical problems. Family medical history. Pregnancy history. Current health, including: Menstrual cycle. Method of birth control. Emotional well-being. Home life and relationship well-being. Sexual activity and sexual health. Lifestyle, including: Alcohol, nicotine or tobacco, and drug use. Access to firearms. Diet, exercise, and sleep habits. Work and work environment. Sunscreen use. Safety issues such as seatbelt and bike helmet use. Physical exam Your health care provider may check your: Height and weight. These may be used to calculate your BMI (body mass index). BMI is a measurement that tells if you are at a healthy weight. Waist circumference. This measures the distance around your waistline. This measurement also tells if you are at a healthy weight and may help predict your risk of certain diseases, such as type 2 diabetes and high blood pressure. Heart rate and blood pressure. Body temperature. Skin for abnormal spots. What immunizations do I need?  Vaccines are usually given at various ages, according to a schedule. Your health care provider will recommend vaccines for you based on your age, medical history, and lifestyle or other factors, such as travel or where you work. What tests do I need? Screening Your health care provider may recommend screening tests for certain conditions. This may include: Pelvic exam and Pap test. Lipid and cholesterol levels. Diabetes screening. This is done by checking your blood sugar (glucose) after you have not eaten for a while (fasting). Hepatitis  B test. Hepatitis C test. HIV (human immunodeficiency virus) test. STI (sexually transmitted infection) testing, if you are at risk. BRCA-related cancer screening. This may be done if you have a family history of breast, ovarian, tubal, or peritoneal cancers. Talk with your health care provider about your test results, treatment options, and if necessary, the need for more tests. Follow these instructions at home: Eating and drinking  Eat a healthy diet that includes fresh fruits and vegetables, whole grains, lean protein, and low-fat dairy products. Take vitamin and mineral supplements as recommended by your health care provider. Do not drink alcohol if: Your health care provider tells you not to drink. You are pregnant, may be pregnant, or are planning to become pregnant. If you drink alcohol: Limit how much you have to 0-1 drink a day. Know how much alcohol is in your drink. In the [place] one drink equals one 12 oz bottle of beer (355 mL), one 5 oz glass of wine (148 mL), or one 1 oz glass of hard liquor (44 mL). Lifestyle Brush your teeth every morning and night with fluoride toothpaste. Floss one time each day. Exercise for at least 30 minutes 5 or more days each week. Do not use any products that contain nicotine or tobacco. These products include cigarettes, chewing tobacco, and vaping devices, such as e-cigarettes. If you need help quitting, ask your health care provider. Do not use drugs. If you are sexually active, practice safe sex. Use a condom or other form of protection to prevent STIs. If you do not wish to become pregnant, use a form of birth control. If you plan to become pregnant, see your health care provider for a prepregnancy visit. Find healthy ways to manage stress, such as: Meditation,   yoga, or listening to music. Journaling. Talking to a trusted person. Spending time with friends and family. Minimize exposure to UV radiation to reduce your risk of skin  cancer. Safety Always wear your seat belt while driving or riding in a vehicle. Do not drive: If you have been drinking alcohol. Do not ride with someone who has been drinking. If you have been using any mind-altering substances or drugs. While texting. When you are tired or distracted. Wear a helmet and other protective equipment during sports activities. If you have firearms in your house, make sure you follow all gun safety procedures. Seek help if you have been physically or sexually abused. What's next? Go to your health care provider once a year for an annual wellness visit. Ask your health care provider how often you should have your eyes and teeth checked. Stay up to date on all vaccines. This information is not intended to replace advice given to you by your health care provider. Make sure you discuss any questions you have with your health care provider. Document Revised: 09/11/2020 Document Reviewed: 09/11/2020 Elsevier Patient Education  2023 Else[employer] Inc[employer] Breast Self-Awareness Breast self-awareness is knowing how your breasts look and feel. You need to: Check your breasts on a regular basis. Tell your doctor about any changes. Become familiar with the look and feel of your breasts. This can help you catch a breast problem while it is still small and can be treated. You should do breast self-exams even if you have breast implants. What you need: A mirror. A well-lit room. A pillow or other soft object. How to do a breast self-exam Follow these steps to do a breast self-exam: Look for changes  Take off all the clothes above your waist. Stand in front of a mirror in a room with good lighting. Put your hands down at your sides. Compare your breasts in the mirror. Look for any difference between them, such as: A difference in shape. A difference in size. Wrinkles, dips, and bumps in one breast and not the other. Look at each breast for changes in the skin, such  as: Redness. Scaly areas. Skin that has gotten thicker. Dimpling. Open sores (ulcers). Look for changes in your nipples, such as: Fluid coming out of a nipple. Fluid around a nipple. Bleeding. Dimpling. Redness. A nipple that looks pushed in (retracted), or that has changed position. Feel for changes Lie on your back. Feel each breast. To do this: Pick a breast to feel. Place a pillow under the shoulder closest to that breast. Put the arm closest to that breast behind your head. Feel the nipple area of that breast using the hand of your other arm. Feel the area with the pads of your three middle fingers by making small circles with your fingers. Use light, medium, and firm pressure. Continue the overlapping circles, moving downward over the breast. Keep making circles with your fingers. Stop when you feel your ribs. Start making circles with your fingers again, this time going upward until you reach your collarbone. Then, make circles outward across your breast and into your armpit area. Squeeze your nipple. Check for discharge and lumps. Repeat these steps to check your other breast. Sit or stand in the tub or shower. With soapy water on your skin, feel each breast the same way you did when you were lying down. Write down what you find Writing down what you find can help you remember what to tell your doctor. Write down: What is   normal for each breast. Any changes you find in each breast. These include: The kind of changes you find. A tender or painful breast. Any lump you find. Write down its size and where it is. When you last had your monthly period (menstrual cycle). General tips If you are breastfeeding, the best time to check your breasts is after you feed your baby or after you use a breast pump. If you get monthly bleeding, the best time to check your breasts is 5-7 days after your monthly cycle ends. With time, you will become comfortable with the self-exam. You will  also start to know if there are changes in your breasts. Contact a doctor if: You see a change in the shape or size of your breasts or nipples. You see a change in the skin of your breast or nipples, such as red or scaly skin. You have fluid coming from your nipples that is not normal. You find a new lump or thick area. You have breast pain. You have any concerns about your breast health. Summary Breast self-awareness includes looking for changes in your breasts and feeling for changes within your breasts. You should do breast self-awareness in front of a mirror in a well-lit room. If you get monthly periods (menstrual cycles), the best time to check your breasts is 5-7 days after your period ends. Tell your doctor about any changes you see in your breasts. Changes include changes in size, changes on the skin, painful or tender breasts, or fluid from your nipples that is not normal. This information is not intended to replace advice given to you by your health care provider. Make sure you discuss any questions you have with your health care provider. Document Revised: 08/21/2021 Document Reviewed: 01/16/2021 Elsevier Patient Education  2023 Elsevier Inc.  

## 2022-08-20 ENCOUNTER — Other Ambulatory Visit (HOSPITAL_COMMUNITY): Payer: Self-pay

## 2022-08-20 ENCOUNTER — Encounter: Payer: Self-pay | Admitting: Obstetrics and Gynecology

## 2022-08-20 LAB — COMPREHENSIVE METABOLIC PANEL
ALT: 16 IU/L (ref 0–32)
AST: 11 IU/L (ref 0–40)
Albumin/Globulin Ratio: 1.9 (ref 1.2–2.2)
Albumin: 4.4 g/dL (ref 3.9–4.9)
Alkaline Phosphatase: 64 IU/L (ref 44–121)
BUN/Creatinine Ratio: 15 (ref 9–23)
BUN: 9 mg/dL (ref 6–20)
Bilirubin Total: 0.3 mg/dL (ref 0.0–1.2)
CO2: 23 mmol/L (ref 20–29)
Calcium: 9.4 mg/dL (ref 8.7–10.2)
Chloride: 104 mmol/L (ref 96–106)
Creatinine, Ser: 0.6 mg/dL (ref 0.57–1.00)
Globulin, Total: 2.3 g/dL (ref 1.5–4.5)
Glucose: 74 mg/dL (ref 70–99)
Potassium: 4.7 mmol/L (ref 3.5–5.2)
Sodium: 139 mmol/L (ref 134–144)
Total Protein: 6.7 g/dL (ref 6.0–8.5)
eGFR: 117 mL/min/{1.73_m2} (ref >59)

## 2022-08-20 LAB — LIPID PANEL
Chol/HDL Ratio: 2.4 ratio (ref 0.0–4.4)
Cholesterol, Total: 149 mg/dL (ref 100–199)
HDL: 61 mg/dL (ref >39)
LDL Chol Calc (NIH): 72 mg/dL (ref 0–99)
Triglycerides: 85 mg/dL (ref 0–149)
VLDL Cholesterol Cal: 16 mg/dL (ref 5–40)

## 2022-08-20 LAB — CBC
Hematocrit: 40 % (ref 34.0–46.6)
Hemoglobin: 13.2 g/dL (ref 11.1–15.9)
MCH: 30.8 pg (ref 26.6–33.0)
MCHC: 33 g/dL (ref 31.5–35.7)
MCV: 94 fL (ref 79–97)
Platelets: 311 10*3/uL (ref 150–450)
RBC: 4.28 x10E6/uL (ref 3.77–5.28)
RDW: 13.3 % (ref 11.7–15.4)
WBC: 6.2 10*3/uL (ref 3.4–10.8)

## 2022-08-20 LAB — TSH: TSH: 0.966 u[IU]/mL (ref 0.450–4.500)

## 2022-08-20 LAB — RPR: RPR Ser Ql: NONREACTIVE

## 2022-08-20 LAB — HEMOGLOBIN A1C
Est. average glucose Bld gHb Est-mCnc: 103 mg/dL
Hgb A1c MFr Bld: 5.2 % (ref 4.8–5.6)

## 2022-08-20 LAB — HIV ANTIBODY (ROUTINE TESTING W REFLEX): HIV Screen 4th Generation wRfx: NONREACTIVE

## 2022-08-20 LAB — HEPATITIS C ANTIBODY: Hep C Virus Ab: NONREACTIVE

## 2022-08-20 MED ORDER — NORETHINDRONE 0.35 MG PO TABS
1.0000 | ORAL_TABLET | Freq: Every day | ORAL | 11 refills | Status: DC
  Filled 2022-08-20: qty 28, 28d supply, fill #0

## 2022-08-26 ENCOUNTER — Encounter: Payer: Self-pay | Admitting: Obstetrics and Gynecology

## 2022-09-01 LAB — CYTOLOGY - PAP
Chlamydia: NEGATIVE
Comment: NEGATIVE
Comment: NEGATIVE
Comment: NEGATIVE
Comment: NEGATIVE
Comment: NORMAL
Diagnosis: UNDETERMINED — AB
HPV 16: NEGATIVE
HPV 18 / 45: NEGATIVE
High risk HPV: POSITIVE — AB
Neisseria Gonorrhea: NEGATIVE

## 2022-10-22 ENCOUNTER — Ambulatory Visit: Payer: Commercial Managed Care - PPO

## 2023-07-26 ENCOUNTER — Other Ambulatory Visit (HOSPITAL_COMMUNITY): Payer: Self-pay

## 2023-07-26 ENCOUNTER — Other Ambulatory Visit: Payer: Self-pay | Admitting: Obstetrics and Gynecology

## 2023-07-26 DIAGNOSIS — Z3041 Encounter for surveillance of contraceptive pills: Secondary | ICD-10-CM

## 2023-07-26 MED ORDER — NORETHINDRONE 0.35 MG PO TABS
1.0000 | ORAL_TABLET | Freq: Every day | ORAL | 0 refills | Status: DC
Start: 2023-07-26 — End: 2023-07-27
  Filled 2023-07-26: qty 84, 84d supply, fill #0

## 2023-07-27 ENCOUNTER — Other Ambulatory Visit (HOSPITAL_COMMUNITY): Payer: Self-pay

## 2023-07-27 MED ORDER — NORETHINDRONE 0.35 MG PO TABS
1.0000 | ORAL_TABLET | Freq: Every day | ORAL | 0 refills | Status: DC
Start: 2023-07-27 — End: 2024-01-03
  Filled 2023-07-27: qty 84, 84d supply, fill #0

## 2023-07-27 NOTE — Addendum Note (Signed)
 Addended by: Lendon Queen on: 07/27/2023 09:07 AM   Modules accepted: Orders

## 2023-08-06 ENCOUNTER — Other Ambulatory Visit (HOSPITAL_COMMUNITY): Payer: Self-pay

## 2023-08-25 ENCOUNTER — Ambulatory Visit: Admitting: Obstetrics

## 2023-08-27 NOTE — Progress Notes (Unsigned)
 GYNECOLOGY ANNUAL PHYSICAL EXAM NOTE  Subjective:    Haley Hall is a 43 y.o. [phone] female who presents for an annual exam.  The patient is sexually active. The patient participates in regular exercise: yes. Has the patient ever been transfused or tattooed?: yes. The patient reports that there is not domestic violence in her life.   The patient has the following complaints today: Has questions about her Pap results from last year. Dd she need a colpo or not? HPV+ and ASCUS, but Dr. Denman Fischer told her HPV negative. Is confused.  Requesting STI screening  Menstrual History: Menarche age: 89 Patient's last menstrual period was 08/17/2023. Period Cycle (Days): 30 Period Duration (Days): 2-3 Period Pattern: Regular Menstrual Flow: Light Dysmenorrhea: (!) Mild Dysmenorrhea Symptoms: Cramping   Gynecologic History:  Contraception: oral progesterone-only contraceptive History of STI's:  Pap: 08/19/22. Results were: abnormal. ASCUS, HPV pos, 16/18/[birth year] neg Pap: 08/24/19 LSIL and Hpv negative. Last mammogram: not had one yet.  OB History  Gravida Para Term Preterm AB Living  6 2 0 0 4 2  SAB IAB Ectopic Multiple Live Births  1 0 0 0 0    # Outcome Date GA Lbr Len/2nd Weight Sex Type Anes PTL Lv  6 AB           5 AB           4 AB           3 SAB           2 Para           1 Para             Past Medical History:  Diagnosis Date   Asthma    Hidradenitis     Past Surgical History:  Procedure Laterality Date   DILATION AND CURETTAGE OF UTERUS     SAB   TONSILLECTOMY      Family History  Problem Relation Age of Onset   Hypertension Mother    ADD / ADHD Sister    Lung cancer Maternal Grandfather     Social History   Socioeconomic History   Marital status: Married    Spouse name: Not on file   Number of children: Not on file   Years of education: Not on file   Highest education level: Not on file  Occupational History   Occupation: [employer]   Tobacco Use   Smoking status: Some Days    Types: E-cigarettes   Smokeless tobacco: Never  Vaping Use   Vaping status: Every Day  Substance and Sexual Activity   Alcohol use: Yes    Comment: Occ   Drug use: Never   Sexual activity: Yes    Partners: Male    Birth control/protection: Pill  Other Topics Concern   Not on file  Social History Narrative   Not on file   Social Drivers of Health   Financial Resource Strain: Not on file  Food Insecurity: Not on file  Transportation Needs: Not on file  Physical Activity: Not on file  Stress: Not on file  Social Connections: Not on file  Intimate Partner Violence: Not on file    Current Outpatient Medications on File Prior to Visit  Medication Sig Dispense Refill   gabapentin (NEURONTIN) 300 MG capsule Take 600 mg by mouth 2 (two) times daily.     norethindrone  (MICRONOR ) 0.35 MG tablet Take 1 tablet (0.35 mg total) by mouth daily. 84 tablet  0   nebivolol  (BYSTOLIC ) 10 MG tablet Take 1 tablet (10 mg total) by mouth at bedtime. (Patient not taking: Reported on 09/01/2023) 90 tablet 3   No current facility-administered medications on file prior to visit.    Allergies  Allergen Reactions   Other Anaphylaxis    Uncoded Allergy. Allergen: FRESH FRUIT, RAW CARROTS, RAW CELERY   Penicillins Rash     Review of Systems Constitutional: negative for chills, fatigue, fevers and sweats Eyes: negative for irritation, redness and visual disturbance Ears, nose, mouth, throat, and face: negative for hearing loss, nasal congestion, snoring and tinnitus Respiratory: negative for asthma, cough, sputum Cardiovascular: negative for chest pain, dyspnea, exertional chest pressure/discomfort, irregular heart beat, palpitations and syncope Gastrointestinal: negative for abdominal pain, change in bowel habits, nausea and vomiting Genitourinary: negative for abnormal menstrual periods, genital lesions, sexual problems and vaginal discharge, dysuria and  urinary incontinence Integument/breast: negative for breast lump, breast tenderness and nipple discharge Hematologic/lymphatic: negative for bleeding and easy bruising Musculoskeletal:negative for back pain and muscle weakness Neurological: negative for dizziness, headaches, vertigo and weakness Endocrine: negative for diabetic symptoms including polydipsia, polyuria and skin dryness Allergic/Immunologic: negative for hay fever and urticaria      Objective:  Blood pressure (!) 134/92, pulse 86, height 5\' 6"  (1.676 m), weight 223 lb (101.2 kg), last menstrual period 08/17/2023. Body mass index is 35.99 kg/m.    General Appearance:    Alert, cooperative, no distress, appears stated age  Head:    Normocephalic, without obvious abnormality, atraumatic  Eyes:    PERRL, conjunctiva/corneas clear, EOMs intact bilaterally  Ears:    Normal external ear canals bilaterally  Nose:   Nares normal  Throat:   Lips, mucosa, and tongue normal; teeth and gums normal  Neck:   Supple, symmetrical, trachea midline, no adenopathy; thyroid: no enlargement/tenderness/nodules; no carotid bruit or JVD  Back:     ROM normal, no CVA tenderness  Lungs:     Clear to auscultation bilaterally, respirations unlabored  Chest Wall:    No tenderness or deformity   Heart:    Regular rate and rhythm, S1 and S2 normal, no appreciated murmur, rub or gallop  Breast Exam:    No tenderness, masses, or nipple abnormality; no skin retraction, dimpling or nipple discharge.  Abdomen:     Soft, non-tender, bowel sounds active all four quadrants, no masses, no organomegaly.    Genitalia:    Pelvic:external genitalia normal, vagina without lesions, discharge, or tenderness, rectovaginal septum  normal. Cervix normal in appearance, no cervical motion tenderness, no adnexal masses or tenderness.  Uterus normal size, shape, mobile, regular contours, nontender.  Rectal:    Normal external sphincter.  No hemorrhoids appreciated. Internal exam  not done.   Extremities:   Extremities normal, atraumatic, no cyanosis or edema  Pulses:   2+ and symmetric all extremities  Skin:   Skin color, texture, turgor normal, no rashes or lesions  Lymph nodes:   Cervical, supraclavicular, and axillary nodes normal  Neurologic:   CNII-XII intact, normal strength, sensation and reflexes throughout   .  Labs:  Lab Results  Component Value Date   WBC 6.2 08/19/2022   HGB 13.2 08/19/2022   HCT 40.0 08/19/2022   MCV 94 08/19/2022   PLT 311 08/19/2022    Lab Results  Component Value Date   CREATININE 0.60 08/19/2022   BUN 9 08/19/2022   NA 139 08/19/2022   K 4.7 08/19/2022   CL 104 08/19/2022   CO2  23 08/19/2022    Lab Results  Component Value Date   ALT 16 08/19/2022   AST 11 08/19/2022   ALKPHOS 64 08/19/2022   BILITOT 0.3 08/19/2022    Lab Results  Component Value Date   TSH 0.966 08/19/2022      09/01/2023    2:10 PM 08/19/2022    9:18 AM  Depression screen PHQ 2/9  Decreased Interest 0 0  Down, Depressed, Hopeless 0 0  PHQ - 2 Score 0 0   Assessment:   1. Well woman exam with routine gynecological exam   2. Encounter for screening mammogram for malignant neoplasm of breast   3. Cervical cancer screening   4. Routine screening for STI (sexually transmitted infection)      Plan:   Haley Hall is a 43 y.o. [phone] female here today for her annual exam, doing well.  -Screenings:  Pap: done with cotesting today -- should have had colposcopy after her 07/2022 pap (ASCUS/HPV+), but will check pap today. If normal, can defer colpo. If abnormal, will proceed with colpo.  Mammogram: ordered Labs: PCP PHQ-2 = 0 -Contraception: Micronor , refilled through 06/2024 by Dr. Denman Fischer -Vaccines: Gardasil #1 today; pt will complete #2/3 at work, [employer] [facility].  -Healthy lifestyle modifications discussed: multivitamin, diet, exercise, sunscreen. Emphasized importance of regular physical activity.  -Folic Acid  recommendation reviewed.  -All questions answered to patient's satisfaction.  -RTC 1 yr for annual, sooner prn.    Sofia Dunn, DO [facility]

## 2023-08-27 NOTE — Patient Instructions (Signed)
 Preventive Care 16-41 Years Old, Female  Preventive care refers to lifestyle choices and visits with your health care provider that can promote health and wellness. Preventive care visits are also called wellness exams.  What can I expect for my preventive care visit?  Counseling  Your health care provider may ask you questions about your:  Medical history, including:  Past medical problems.  Family medical history.  Pregnancy history.  Current health, including:  Menstrual cycle.  Method of birth control.  Emotional well-being.  Home life and relationship well-being.  Sexual activity and sexual health.  Lifestyle, including:  Alcohol, nicotine or tobacco, and drug use.  Access to firearms.  Diet, exercise, and sleep habits.  Work and work Astron[employer].  Sunscreen use.  Safety issues such as seatbelt and bike helmet use.  Physical exam  Your health care provider will check your:  Height and weight. These may be used to calculate your BMI (body mass index). BMI is a measurement that tells if you are at a healthy weight.  Waist circumference. This measures the distance around your waistline. This measurement also tells if you are at a healthy weight and may help predict your risk of certain diseases, such as type 2 diabetes and high blood pressure.  Heart rate and blood pressure.  Body temperature.  Skin for abnormal spots.  What immunizations do I need?    Vaccines are usually given at various ages, according to a schedule. Your health care provider will recommend vaccines for you based on your age, medical history, and lifestyle or other factors, such as travel or where you work.  What tests do I need?  Screening  Your health care provider may recommend screening tests for certain conditions. This may include:  Lipid and cholesterol levels.  Diabetes screening. This is done by checking your blood sugar (glucose) after you have not eaten for a while (fasting).  Pelvic exam and Pap test.  Hepatitis B test.  Hepatitis C  test.  HIV (human immunodeficiency virus) test.  STI (sexually transmitted infection) testing, if you are at risk.  Lung cancer screening.  Colorectal cancer screening.  Mammogram. Talk with your health care provider about when you should start having regular mammograms. This may depend on whether you have a family history of breast cancer.  BRCA-related cancer screening. This may be done if you have a family history of breast, ovarian, tubal, or peritoneal cancers.  Bone density scan. This is done to screen for osteoporosis.  Talk with your health care provider about your test results, treatment options, and if necessary, the need for more tests.  Follow these instructions at home:  Eating and drinking    Eat a diet that includes fresh fruits and vegetables, whole grains, lean protein, and low-fat dairy products.  Take vitamin and mineral supplements as recommended by your health care provider.  Do not drink alcohol if:  Your health care provider tells you not to drink.  You are pregnant, may be pregnant, or are planning to become pregnant.  If you drink alcohol:  Limit how much you have to 0-1 drink a day.  Know how much alcohol is in your drink. In the [place] one drink equals one 12 oz bottle of beer (355 mL), one 5 oz glass of wine (148 mL), or one 1 oz glass of hard liquor (44 mL).  Lifestyle  Brush your teeth every morning and night with fluoride toothpaste. Floss one time each day.  Exercise for at least  30 minutes 5 or more days each week.  Do not use any products that contain nicotine or tobacco. These products include cigarettes, chewing tobacco, and vaping devices, such as e-cigarettes. If you need help quitting, ask your health care provider.  Do not use drugs.  If you are sexually active, practice safe sex. Use a condom or other form of protection to prevent STIs.  If you do not wish to become pregnant, use a form of birth control. If you plan to become pregnant, see your health care provider for a  prepregnancy visit.  Take aspirin only as told by your health care provider. Make sure that you understand how much to take and what form to take. Work with your health care provider to find out whether it is safe and beneficial for you to take aspirin daily.  Find healthy ways to manage stress, such as:  Meditation, yoga, or listening to music.  Journaling.  Talking to a trusted person.  Spending time with friends and family.  Minimize exposure to UV radiation to reduce your risk of skin cancer.  Safety  Always wear your seat belt while driving or riding in a vehicle.  Do not drive:  If you have been drinking alcohol. Do not ride with someone who has been drinking.  When you are tired or distracted.  While texting.  If you have been using any mind-altering substances or drugs.  Wear a helmet and other protective equipment during sports activities.  If you have firearms in your house, make sure you follow all gun safety procedures.  Seek help if you have been physically or sexually abused.  What's next?  Visit your health care provider once a year for an annual wellness visit.  Ask your health care provider how often you should have your eyes and teeth checked.  Stay up to date on all vaccines.  This information is not intended to replace advice given to you by your health care provider. Make sure you discuss any questions you have with your health care provider.  Document Revised: 09/11/2020 Document Reviewed: 09/11/2020  Elsevier Patient Education  2024 [employer].

## 2023-09-01 ENCOUNTER — Other Ambulatory Visit (HOSPITAL_COMMUNITY)
Admission: RE | Admit: 2023-09-01 | Discharge: 2023-09-01 | Disposition: A | Discharge status: Home / Self Care | Source: Ambulatory Visit | Attending: Obstetrics | Admitting: Obstetrics

## 2023-09-01 ENCOUNTER — Encounter: Payer: Self-pay | Admitting: Obstetrics

## 2023-09-01 ENCOUNTER — Ambulatory Visit (INDEPENDENT_AMBULATORY_CARE_PROVIDER_SITE_OTHER): Admitting: Obstetrics

## 2023-09-01 VITALS — BP 134/92 | HR 86 | Ht 66.0 in | Wt 223.0 lb

## 2023-09-01 DIAGNOSIS — Z01419 Encounter for gynecological examination (general) (routine) without abnormal findings: Secondary | ICD-10-CM | POA: Diagnosis not present

## 2023-09-01 DIAGNOSIS — Z23 Encounter for immunization: Secondary | ICD-10-CM

## 2023-09-01 DIAGNOSIS — Z124 Encounter for screening for malignant neoplasm of cervix: Secondary | ICD-10-CM

## 2023-09-01 DIAGNOSIS — Z1231 Encounter for screening mammogram for malignant neoplasm of breast: Secondary | ICD-10-CM

## 2023-09-01 DIAGNOSIS — Z113 Encounter for screening for infections with a predominantly sexual mode of transmission: Secondary | ICD-10-CM

## 2023-09-02 LAB — HEP, RPR, HIV PANEL
HIV Screen 4th Generation wRfx: NONREACTIVE
Hepatitis B Surface Ag: NEGATIVE
RPR Ser Ql: NONREACTIVE

## 2023-09-07 ENCOUNTER — Ambulatory Visit: Payer: Self-pay | Admitting: Obstetrics

## 2023-09-07 LAB — CYTOLOGY - PAP
Chlamydia: NEGATIVE
Comment: NEGATIVE
Comment: NEGATIVE
Comment: NEGATIVE
Comment: NEGATIVE
Comment: NEGATIVE
Comment: NORMAL
Diagnosis: NEGATIVE
Diagnosis: REACTIVE
HPV 16: NEGATIVE
HPV 18 / 45: NEGATIVE
High risk HPV: POSITIVE — AB
Neisseria Gonorrhea: NEGATIVE
Trichomonas: NEGATIVE

## 2023-10-14 NOTE — Progress Notes (Deleted)
 Referring Provider:  ESTIL MANGLE DO  HPI:  Haley Hall is a 43 y.o.  [phone]  who presents today for evaluation and management of abnormal cervical cytology.    Prior pap smears:  Date:09/01/23   Eje:WPOF        YEC:ENDPUPCZ  16/18/45 NEGATIVE  Date:08/19/22 Eje:JDRLD    YEC:ENDPUPCZ  16/18/45 NEGATIVE Date:08/24/19 Eje:ODPO         YEC:WZHJUPCZ  Prior cervical / vaginal findings: *** Date:*** Results: ***  Prior cervical treatment(s): *** Date:*** Results: ***  Symptoms/History:  -Abnormal vaginal discharge: *** -Postmenopausal: *** -Intermenstrual bleeding: *** -Postcoital bleeding: *** -Bleeding problems (non-gyn): *** -Contraception: *** -Number of current sexual partners: *** -Number of partners in lifetime: *** -History of a high risk partner: *** -History of STDs: *** -Smoking: *** -Gardasil Vaccine: ***      ROS:  {Ros - complete:30496}  OB History  Gravida Para Term Preterm AB Living  6 2   4 2   SAB IAB Ectopic Multiple Live Births  1        # Outcome Date GA Lbr Len/2nd Weight Sex Type Anes PTL Lv  6 AB           5 AB           4 AB           3 SAB           2 Para           1 Para             Past Medical History:  Diagnosis Date   Asthma    Hidradenitis     Past Surgical History:  Procedure Laterality Date   DILATION AND CURETTAGE OF UTERUS     SAB   TONSILLECTOMY      SOCIAL HISTORY:  Social History   Substance and Sexual Activity  Alcohol Use Yes   Comment: Occ    Social History   Substance and Sexual Activity  Drug Use Never     Family History  Problem Relation Age of Onset   Hypertension Mother    ADD / ADHD Sister    Lung cancer Maternal Grandfather     ALLERGIES:  Other and Penicillins  She has a current medication list which includes the following prescription(s): gabapentin, nebivolol , and norethindrone .  Physical Exam: -Vitals:  There were no vitals taken for this visit.  PROCEDURE: Colposcopy  performed with 4% acetic acid and Lugol's after informed consent obtained.  Physical Exam                            -Aceto-white Lesions Location(s): See above              -Biopsy performed at *** o'clock               -ECC indicated and performed: {yes no:314532}     -Biopsy sites made hemostatic with pressure and Monsel's solution   -Satisfactory colposcopy: {yes no:314532}    -Evidence of Invasive cervical CA :  NO  ASSESSMENT:  Haley Hall is a 43 y.o. [phone] with LSIL***ASCUS and HPV-HR positive*** 16/18/45 POS***NEG on recent pap (DATE), here for colposcopy today, performed as above without complications.  -ECC and 3 cervical bx sent to pathology -Aftercare instructions for home reviewed, si/sx of when to call/return discussed. -RTC 2 weeks for results, sooner prn  No orders of the defined types  were placed in this encounter.          F/U  No follow-ups on file.   Estil Mangle, DO [facility]

## 2023-10-14 NOTE — Patient Instructions (Incomplete)
 Colposcopy, Care After  The following information offers guidance on how to care for yourself after your procedure. Your doctor may also give you more specific instructions. If you have problems or questions, contact your doctor. What can I expect after the procedure? If you did not have a sample of your tissue taken out (did not have a biopsy), you may only have some spotting of blood for a few days. You can go back to your normal activities. If you had a sample of your tissue taken out, it is common to have: Soreness and mild pain. These may last for a few days. Mild bleeding or fluid (discharge) coming from your vagina. The fluid will look dark and grainy. You may have this for a few days. The fluid may be caused by a liquid that was used during your procedure. You may need to wear a sanitary pad. Spotting of blood for at least 48 hours after the procedure. Follow these instructions at home: Medicines Take over-the-counter and prescription medicines only as told by your doctor. Ask your doctor what over-the-counter pain medicines and prescription medicines you can start taking again. This is very important if you take blood thinners. Activity For at least 3 days, or for as long as told by your doctor, avoid: Douching. Using tampons. Having sex. Return to your normal activities as told by your doctor. Ask your doctor what activities are safe for you. General instructions Ask your doctor if you may take baths, swim, or use a hot tub. You may take showers. If you use birth control (contraception), keep using it. Keep all follow-up visits. Contact a doctor if: You have a fever or chills. You faint or feel light-headed. Get help right away if: You bleed a lot from your vagina. A lot of bleeding means that the bleeding soaks through a pad in less than 1 hour. You have clumps of blood (blood clots) coming from your vagina. You have signs that could mean you have an infection. This may be  fluid coming from your vagina that is: Different than normal. Yellow. Bad-smelling. You have very bad pain or cramps in your lower belly that do not get better with medicine. Summary If you did not have a sample of your tissue taken out, you may only have some spotting of blood for a few days. You can go back to your normal activities. If you had a sample of your tissue taken out, it is common to have mild pain for a few days and spotting for 48 hours. Avoid douching, using tampons, and having sex for at least 3 days after the procedure or for as long as told. Get help right away if you have a lot of bleeding, very bad pain, or signs of infection. This information is not intended to replace advice given to you by your health care provider. Make sure you discuss any questions you have with your health care provider. Document Revised: 08/11/2020 Document Reviewed: 08/11/2020 Elsevier Patient Education  2024 [employer].

## 2023-10-19 ENCOUNTER — Encounter: Admitting: Obstetrics

## 2023-11-15 NOTE — Progress Notes (Signed)
 Referring Provider:  AOB  HPI:  Haley Hall is a 43 y.o.  [id number]  who presents today for evaluation and management of abnormal cervical cytology.    Prior pap smears:  Date:09/01/23    Eje:WPOF        YEC:ENDPUPCZ, 16/18/45 NEGATIVE  Date:08/19/22  Eje:JDRLD    YEC:ENDPUPCZ, 16/18/45 NEGATIVE Date:08/24/19  Eje:ODPO         YEC:WZHJUPCZ  Prior cervical / vaginal findings: N/A  Prior cervical treatment(s): N/A  Symptoms/History:  -Abnormal vaginal discharge: no -Postmenopausal: no -Intermenstrual bleeding: no -Postcoital bleeding: no -Bleeding problems (non-gyn): no -Contraception: pills  -Number of current sexual partners: 1 -Number of partners in lifetime: 10 -History of a high risk partner: no -History of STDs: yes -Smoking: vape -Gardasil Vaccine: currently completing series, 2 of 3 done       ROS:  Pertinent items are noted in HPI.  OB History  Gravida Para Term Preterm AB Living  6 2   4 2   SAB IAB Ectopic Multiple Live Births  1        # Outcome Date GA Lbr Len/2nd Weight Sex Type Anes PTL Lv  6 AB           5 AB           4 AB           3 SAB           2 Para           1 Para             Past Medical History:  Diagnosis Date   Asthma    Hidradenitis     Past Surgical History:  Procedure Laterality Date   DILATION AND CURETTAGE OF UTERUS     SAB   TONSILLECTOMY      SOCIAL HISTORY:  Social History   Substance and Sexual Activity  Alcohol Use Yes   Comment: Occ    Social History   Substance and Sexual Activity  Drug Use Never     Family History  Problem Relation Age of Onset   Hypertension Mother    ADD / ADHD Sister    Lung cancer Maternal Grandfather     ALLERGIES:  Other and Penicillins  She has a current medication list which includes the following prescription(s): gabapentin, norethindrone , and nebivolol .  Physical Exam: -Vitals:  BP 123/87   Pulse 91   Ht 5' 6 (1.676 m)   Wt 215 lb (97.5 kg)   LMP 11/01/2023    BMI 34.70 kg/m   PROCEDURE: Colposcopy performed with 4% acetic acid and Lugol's after informed consent obtained.  Physical Exam Vitals and nursing note reviewed. Exam conducted with a chaperone present.  Constitutional:      Appearance: Normal appearance. She is obese.  HENT:     Head: Normocephalic and atraumatic.  Eyes:     Extraocular Movements: Extraocular movements intact.  Pulmonary:     Effort: Pulmonary effort is normal.  Genitourinary:    General: Normal vulva.     Vagina: Lesions (two areas of skin-tag like lesions at the posterior fourchette) present.     Cervix: Normal. No cervical motion tenderness, discharge, friability, lesion, erythema or cervical bleeding.  Neurological:     General: No focal deficit present.     Mental Status: She is alert.                                -  Aceto-white Lesions Location(s): none              -Biopsy performed: none               -ECC indicated and performed: Yes.     -Satisfactory colposcopy: Yes.      -Evidence of Invasive cervical CA :  NO  ASSESSMENT:  Haley Hall is a 43 y.o. [phone] with ASCUS and HPV-HR positive, 16/18/45 NEG on recent pap (08/19/22), did not have colposcopy due to miscommunication between pt and Dr. Connell, and follow up pap 09/01/23 showed NILM, HPV still present, 16/18/45 NEG. Colposcopy today saw no cervical lesions to biopsy, performed as above without complications.  -ECC sent to pathology -Aftercare instructions for home reviewed, si/sx of when to call/return discussed. -Discussed removal of posterior fourchette skin-tags vs HPV, may schedule when desires -Will call with results   Estil Mangle, DO [facility]

## 2023-11-26 ENCOUNTER — Encounter: Payer: Self-pay | Admitting: Obstetrics

## 2023-11-26 ENCOUNTER — Ambulatory Visit: Admitting: Obstetrics

## 2023-11-26 ENCOUNTER — Other Ambulatory Visit (HOSPITAL_COMMUNITY)
Admission: RE | Admit: 2023-11-26 | Discharge: 2023-11-26 | Disposition: A | Discharge status: Home / Self Care | Source: Ambulatory Visit | Attending: Obstetrics | Admitting: Obstetrics

## 2023-11-26 VITALS — BP 123/87 | HR 91 | Ht 66.0 in | Wt 215.0 lb

## 2023-11-26 DIAGNOSIS — Z3202 Encounter for pregnancy test, result negative: Secondary | ICD-10-CM

## 2023-11-26 DIAGNOSIS — R87810 Cervical high risk human papillomavirus (HPV) DNA test positive: Secondary | ICD-10-CM | POA: Diagnosis present

## 2023-11-26 DIAGNOSIS — N888 Other specified noninflammatory disorders of cervix uteri: Secondary | ICD-10-CM | POA: Diagnosis not present

## 2023-11-26 LAB — POCT URINE PREGNANCY: Preg Test, Ur: NEGATIVE

## 2023-11-26 NOTE — Patient Instructions (Signed)
 Colposcopy, Care After  The following information offers guidance on how to care for yourself after your procedure. Your doctor may also give you more specific instructions. If you have problems or questions, contact your doctor. What can I expect after the procedure? If you did not have a sample of your tissue taken out (did not have a biopsy), you may only have some spotting of blood for a few days. You can go back to your normal activities. If you had a sample of your tissue taken out, it is common to have: Soreness and mild pain. These may last for a few days. Mild bleeding or fluid (discharge) coming from your vagina. The fluid will look dark and grainy. You may have this for a few days. The fluid may be caused by a liquid that was used during your procedure. You may need to wear a sanitary pad. Spotting of blood for at least 48 hours after the procedure. Follow these instructions at home: Medicines Take over-the-counter and prescription medicines only as told by your doctor. Ask your doctor what over-the-counter pain medicines and prescription medicines you can start taking again. This is very important if you take blood thinners. Activity For at least 3 days, or for as long as told by your doctor, avoid: Douching. Using tampons. Having sex. Return to your normal activities as told by your doctor. Ask your doctor what activities are safe for you. General instructions Ask your doctor if you may take baths, swim, or use a hot tub. You may take showers. If you use birth control (contraception), keep using it. Keep all follow-up visits. Contact a doctor if: You have a fever or chills. You faint or feel light-headed. Get help right away if: You bleed a lot from your vagina. A lot of bleeding means that the bleeding soaks through a pad in less than 1 hour. You have clumps of blood (blood clots) coming from your vagina. You have signs that could mean you have an infection. This may be  fluid coming from your vagina that is: Different than normal. Yellow. Bad-smelling. You have very bad pain or cramps in your lower belly that do not get better with medicine. Summary If you did not have a sample of your tissue taken out, you may only have some spotting of blood for a few days. You can go back to your normal activities. If you had a sample of your tissue taken out, it is common to have mild pain for a few days and spotting for 48 hours. Avoid douching, using tampons, and having sex for at least 3 days after the procedure or for as long as told. Get help right away if you have a lot of bleeding, very bad pain, or signs of infection. This information is not intended to replace advice given to you by your health care provider. Make sure you discuss any questions you have with your health care provider. Document Revised: 08/11/2020 Document Reviewed: 08/11/2020 Elsevier Patient Education  2024 [employer].

## 2023-11-30 LAB — SURGICAL PATHOLOGY

## 2023-12-01 ENCOUNTER — Ambulatory Visit: Payer: Self-pay | Admitting: Obstetrics

## 2024-01-03 ENCOUNTER — Telehealth: Payer: Self-pay

## 2024-01-03 DIAGNOSIS — Z3041 Encounter for surveillance of contraceptive pills: Secondary | ICD-10-CM

## 2024-01-03 MED ORDER — NORETHINDRONE 0.35 MG PO TABS: 1.0000 | ORAL_TABLET | Freq: Every day | ORAL | 2 refills | Status: AC

## 2024-01-03 NOTE — Telephone Encounter (Signed)
 Chart reviewed. Last rx sent 07/27/23 #84 w/O Rf. Rx sent for #84 w/2 refills to get to next annual 08/2024.

## 2024-01-03 NOTE — Telephone Encounter (Signed)
 SABRA
